# Patient Record
Sex: Female | Born: 1955 | Hispanic: No | Marital: Single | Smoking: Never smoker
Health system: Southern US, Community
[De-identification: ages and names within clinical notes are randomized; demographics above are authoritative.]

## PROBLEM LIST (undated history)

## (undated) DIAGNOSIS — E669 Obesity, unspecified: Secondary | ICD-10-CM

## (undated) DIAGNOSIS — M545 Low back pain, unspecified: Secondary | ICD-10-CM

## (undated) DIAGNOSIS — I10 Essential (primary) hypertension: Secondary | ICD-10-CM

## (undated) DIAGNOSIS — I639 Cerebral infarction, unspecified: Secondary | ICD-10-CM

## (undated) DIAGNOSIS — E7800 Pure hypercholesterolemia, unspecified: Secondary | ICD-10-CM

## (undated) DIAGNOSIS — R600 Localized edema: Secondary | ICD-10-CM

## (undated) DIAGNOSIS — M79673 Pain in unspecified foot: Secondary | ICD-10-CM

## (undated) DIAGNOSIS — K219 Gastro-esophageal reflux disease without esophagitis: Secondary | ICD-10-CM

## (undated) DIAGNOSIS — G8929 Other chronic pain: Secondary | ICD-10-CM

## (undated) DIAGNOSIS — I6381 Other cerebral infarction due to occlusion or stenosis of small artery: Secondary | ICD-10-CM

## (undated) DIAGNOSIS — G5603 Carpal tunnel syndrome, bilateral upper limbs: Secondary | ICD-10-CM

## (undated) DIAGNOSIS — R300 Dysuria: Secondary | ICD-10-CM

## (undated) HISTORY — DX: Localized edema: R60.0

## (undated) HISTORY — DX: Gastro-esophageal reflux disease without esophagitis: K21.9

## (undated) HISTORY — PX: ABDOMINAL HYSTERECTOMY: SHX81

## (undated) HISTORY — DX: Cerebral infarction, unspecified: I63.9

## (undated) HISTORY — DX: Hypercalcemia: E83.52

## (undated) HISTORY — DX: Pain in unspecified foot: M79.673

## (undated) HISTORY — DX: Carpal tunnel syndrome, bilateral upper limbs: G56.03

## (undated) HISTORY — DX: Essential (primary) hypertension: I10

## (undated) HISTORY — DX: Other cerebral infarction due to occlusion or stenosis of small artery: I63.81

## (undated) HISTORY — DX: Obesity, unspecified: E66.9

## (undated) HISTORY — PX: BREAST EXCISIONAL BIOPSY: SUR124

## (undated) HISTORY — DX: Dysuria: R30.0

## (undated) HISTORY — DX: Other chronic pain: G89.29

## (undated) HISTORY — DX: Low back pain, unspecified: M54.50

## (undated) HISTORY — DX: Low back pain: M54.5

## (undated) HISTORY — DX: Pure hypercholesterolemia, unspecified: E78.00

---

## 2014-06-28 ENCOUNTER — Ambulatory Visit: Payer: Self-pay | Admitting: Internal Medicine

## 2014-07-01 ENCOUNTER — Encounter: Payer: Self-pay | Admitting: Internal Medicine

## 2014-07-01 ENCOUNTER — Ambulatory Visit: Payer: Medicaid Other | Attending: Internal Medicine | Admitting: Internal Medicine

## 2014-07-01 VITALS — BP 146/88 | HR 69 | Temp 98.6°F | Resp 16 | Ht 62.21 in | Wt 201.0 lb

## 2014-07-01 DIAGNOSIS — M545 Low back pain, unspecified: Secondary | ICD-10-CM

## 2014-07-01 DIAGNOSIS — E785 Hyperlipidemia, unspecified: Secondary | ICD-10-CM | POA: Diagnosis not present

## 2014-07-01 DIAGNOSIS — Z0000 Encounter for general adult medical examination without abnormal findings: Secondary | ICD-10-CM | POA: Diagnosis present

## 2014-07-01 DIAGNOSIS — I10 Essential (primary) hypertension: Secondary | ICD-10-CM

## 2014-07-01 DIAGNOSIS — Z90710 Acquired absence of both cervix and uterus: Secondary | ICD-10-CM | POA: Diagnosis not present

## 2014-07-01 DIAGNOSIS — Z8249 Family history of ischemic heart disease and other diseases of the circulatory system: Secondary | ICD-10-CM | POA: Diagnosis not present

## 2014-07-01 DIAGNOSIS — M5136 Other intervertebral disc degeneration, lumbar region: Secondary | ICD-10-CM | POA: Diagnosis not present

## 2014-07-01 MED ORDER — DICLOFENAC SODIUM 1 % TD GEL: 2.0000 g | Freq: Four times a day (QID) | TRANSDERMAL | Status: DC

## 2014-07-01 MED ORDER — TRAMADOL HCL 50 MG PO TABS: 50.0000 mg | ORAL_TABLET | Freq: Three times a day (TID) | ORAL | Status: DC | PRN

## 2014-07-01 MED ORDER — ROSUVASTATIN CALCIUM 10 MG PO TABS: 10.0000 mg | ORAL_TABLET | Freq: Every day | ORAL | Status: DC

## 2014-07-01 MED ORDER — ATORVASTATIN CALCIUM 10 MG PO TABS: 10.0000 mg | ORAL_TABLET | Freq: Every day | ORAL | Status: DC

## 2014-07-01 MED ORDER — OLMESARTAN MEDOXOMIL-HCTZ 20-12.5 MG PO TABS: 1.0000 | ORAL_TABLET | Freq: Every day | ORAL | Status: DC

## 2014-07-01 MED ORDER — LISINOPRIL-HYDROCHLOROTHIAZIDE 20-12.5 MG PO TABS: 1.0000 | ORAL_TABLET | Freq: Every day | ORAL | Status: DC

## 2014-07-01 NOTE — Progress Notes (Signed)
Patient ID: Brandy MaduraMaida Braun, female   DOB: 08/19/1956, 58 y.o.   MRN: [id number]  WGN:[id number]:[id number]  HQI:[id number]  DOB - 10/26/1955  CC:  Chief Complaint  Patient presents with  . Establish Care       HPI: Brandy Braun is a 58 y.o. female here today to establish medical care.  Patient has a past medical history of hypertension and hyperlipidemia.  She has a family history of hypertension, high cholesterol, and MI's. She has currently been taking crestor 10 mg and telmisartan 40 mg daily.  She has no medication complaints at this time.  She [place] moved here [place] 2 [place] ago with family members.  She present with concerns of lower back pain.  She has a letter from her physician that states that she has severe LBP without sciatica with DDD of L4-L5 and buldging disc at that level.  She has taken tylenol extra strenth, muscle relaxtants, naprosyn, and othetr pain relievers from her country without relief.  She has had a full hysterectomy while in her country, she has never had intercourse due to religion concerns.     Last blood work was in July/2015---trig-80 cholest-197 HDL-65 LDL is 116   Patient has No headache, No chest pain, No abdominal pain - No Nausea, No new weakness tingling or numbness, No Cough - SOB.  Allergies not on file History reviewed. No pertinent past medical history. No current outpatient prescriptions on file prior to visit.   No current facility-administered medications on file prior to visit.   History reviewed. No pertinent family history. History   Social History  . Marital Status: Single    Spouse Name: N/A    Number of Children: N/A  . Years of Education: N/A   Occupational History  . Not on file.   Social History Main Topics  . Smoking status: Never Smoker   . Smokeless tobacco: Not on file  . Alcohol Use: No  . Drug Use: No  . Sexual Activity: No   Other Topics Concern  . Not on file   Social History Narrative  . No narrative on file      Review of Systems: Constitutional: Negative for fever, chills, diaphoresis, activity change, appetite change and fatigue. HENT: Negative for ear pain, nosebleeds, congestion, facial swelling, rhinorrhea, neck pain, neck stiffness and ear discharge.  Eyes: Negative for pain, discharge, redness, itching and visual disturbance. Respiratory: Negative for cough, choking, chest tightness, shortness of breath, wheezing and stridor.  Cardiovascular: Negative for chest pain, palpitations and leg swelling. Gastrointestinal: Negative for abdominal distention. Genitourinary: Negative for dysuria, urgency, frequency, hematuria, flank pain, decreased urine volume, difficulty urinating and dyspareunia.  Musculoskeletal: Negative for joint swelling, arthralgia and gait problem. + back pain Neurological: Negative for dizziness, tremors, seizures, syncope, facial asymmetry, speech difficulty, weakness, light-headedness, numbness and headaches.  Hematological: Negative for adenopathy. Does not bruise/bleed easily. Psychiatric/Behavioral: Negative for hallucinations, behavioral problems, confusion, dysphoric mood, decreased concentration and agitation.    Objective:   Filed Vitals:   07/01/14 1101  BP: 155/97  Pulse: 69  Temp: 98.6 F (37 C)  Resp: 16    Physical Exam  Constitutional: She is oriented to person, place, and time.  Cardiovascular: Normal rate, regular rhythm, normal heart sounds and intact distal pulses.   No murmur heard. Pulmonary/Chest: Effort normal and breath sounds normal.  Abdominal: Soft. Bowel sounds are normal.  Musculoskeletal: She exhibits edema (Left ankle 2+ non pitting and right trace edema).  Neurological: She is alert and  oriented to person, place, and time.      No results found for: WBC, HGB, HCT, MCV, PLT No results found for: CREATININE, BUN, NA, K, CL, CO2  No results found for: HGBA1C Lipid Panel  No results found for: CHOL, TRIG, HDL, CHOLHDL, VLDL,  LDLCALC     Assessment and plan:   Burton ApleyMaida was seen today for establish care.  Diagnoses and associated orders for this visit:  Essential hypertension Patient will finish remaining telmisartan and switch to zestoretic. After beginning new med she will come back for a BP check in 2 weeks - lisinopril-hydrochlorothiazide (ZESTORETIC) 20-12.5 MG per tablet; Take 1 tablet by mouth daily.  Explained to patient diet modifications that may contribute to elevated BP. Patient will avoid foods that are high in sodium such as  canned soups and vegetables, tomato juice, commercial baked goods, commercially prepared frozen or canned entrees and sauces. Avoid salty snacks, added salt when cooking, and substituting for low sodium herbs or spices Explained exercise regimen of cardio at least three times weekly to help lower BP and cholesterol  HLD (hyperlipidemia) - Discontinue: rosuvastatin (CRESTOR) 10 MG tablet; Take 1 tablet (10 mg total) by mouth daily. Due to cost of med, insurance does not cover - Switched to atorvastatin (LIPITOR) 10 MG tablet; Take 1 tablet (10 mg total) by mouth daily.  Midline low back pain without sciatica - traMADol (ULTRAM) 50 MG tablet; Take 1 tablet (50 mg total) by mouth every 8 (eight) hours as needed. - diclofenac sodium (VOLTAREN) 1 % GEL; Apply 2 g topically 4 (four) times daily.  Patient given exercises to complete at home. Help of heating pad may help with pain   Return in about 4 weeks (around 07/29/2014) for Nurse Visit-BP check and 3 mo PCP.     Holland CommonsKECK, VALERIE, NP-C [facility] [phone] 07/01/2014, 11:24 AM

## 2014-07-01 NOTE — Progress Notes (Signed)
Pt is here to establish care. Pt states that she takes medication for BP and cholesterol. Pt reports having chronic lower back pain.

## 2014-07-01 NOTE — Patient Instructions (Signed)
Exercise to Lose Weight Exercise and a healthy diet may help you lose weight. Your doctor may suggest specific exercises. EXERCISE IDEAS AND TIPS  Choose low-cost things you enjoy doing, such as walking, bicycling, or exercising to workout videos.  Take stairs instead of the elevator.  Walk during your lunch break.  Park your car further away from work or school.  Go to a gym or an exercise class.  Start with 5 to 10 minutes of exercise each day. Build up to 30 minutes of exercise 4 to 6 days a week.  Wear shoes with good support and comfortable clothes.  Stretch before and after working out.  Work out until you breathe harder and your heart beats faster.  Drink extra water when you exercise.  Do not do so much that you hurt yourself, feel dizzy, or get very short of breath. Exercises that burn about 150 calories:  Running 1  miles in 15 minutes.  Playing volleyball for 45 to 60 minutes.  Washing and waxing a car for 45 to 60 minutes.  Playing touch football for 45 minutes.  Walking 1  miles in 35 minutes.  Pushing a stroller 1  miles in 30 minutes.  Playing basketball for 30 minutes.  Raking leaves for 30 minutes.  Bicycling 5 miles in 30 minutes.  Walking 2 miles in 30 minutes.  Dancing for 30 minutes.  Shoveling snow for 15 minutes.  Swimming laps for 20 minutes.  Walking up stairs for 15 minutes.  Bicycling 4 miles in 15 minutes.  Gardening for 30 to 45 minutes.  Jumping rope for 15 minutes.  Washing windows or floors for 45 to 60 minutes. Document Released: 09/14/2010 Document Revised: 11/04/2011 Document Reviewed: 09/14/2010 ExitCare Patient Information 2015 [employer]. This information is not intended to replace advice given to you by your health care provider. Make sure you discuss any questions you have with your health care provider.  

## 2014-07-05 ENCOUNTER — Encounter: Payer: Self-pay | Admitting: Internal Medicine

## 2014-07-29 ENCOUNTER — Ambulatory Visit: Payer: Medicaid Other | Attending: Internal Medicine

## 2014-07-29 ENCOUNTER — Ambulatory Visit: Payer: Medicaid Other | Admitting: *Deleted

## 2014-07-29 VITALS — BP 136/79 | HR 74 | Temp 97.9°F | Resp 20

## 2014-07-29 DIAGNOSIS — I10 Essential (primary) hypertension: Secondary | ICD-10-CM | POA: Diagnosis present

## 2014-07-29 DIAGNOSIS — E785 Hyperlipidemia, unspecified: Secondary | ICD-10-CM | POA: Insufficient documentation

## 2014-07-29 LAB — COMPLETE METABOLIC PANEL WITH GFR
ALBUMIN: 4.1 g/dL (ref 3.5–5.2)
ALT: 14 U/L (ref 0–35)
AST: 15 U/L (ref 0–37)
Alkaline Phosphatase: 74 U/L (ref 39–117)
BUN: 10 mg/dL (ref 6–23)
CALCIUM: 10.1 mg/dL (ref 8.4–10.5)
CO2: 28 mEq/L (ref 19–32)
Chloride: 102 mEq/L (ref 96–112)
Creat: 0.75 mg/dL (ref 0.50–1.10)
GFR, Est African American: >89 mL/min
GFR, Est Non African American: 89 mL/min
Glucose, Bld: 92 mg/dL (ref 70–99)
Potassium: 4.5 mEq/L (ref 3.5–5.3)
SODIUM: 139 meq/L (ref 135–145)
Total Bilirubin: 0.5 mg/dL (ref 0.2–1.2)
Total Protein: 6.8 g/dL (ref 6.0–8.3)

## 2014-07-29 LAB — CBC WITH DIFFERENTIAL/PLATELET
Basophils Absolute: 0.1 10*3/uL (ref 0.0–0.1)
Basophils Relative: 1 % (ref 0–1)
Eosinophils Absolute: 0.2 10*3/uL (ref 0.0–0.7)
Eosinophils Relative: 3 % (ref 0–5)
HCT: 40.8 % (ref 36.0–46.0)
HEMOGLOBIN: 14 g/dL (ref 12.0–15.0)
Lymphocytes Relative: 43 % (ref 12–46)
Lymphs Abs: 2.4 10*3/uL (ref 0.7–4.0)
MCH: 29.1 pg (ref 26.0–34.0)
MCHC: 34.3 g/dL (ref 30.0–36.0)
MCV: 84.8 fL (ref 78.0–100.0)
MPV: 10.3 fL (ref 9.4–12.4)
Monocytes Absolute: 0.3 10*3/uL (ref 0.1–1.0)
Monocytes Relative: 6 % (ref 3–12)
NEUTROS ABS: 2.6 10*3/uL (ref 1.7–7.7)
Neutrophils Relative %: 47 % (ref 43–77)
Platelets: 257 10*3/uL (ref 150–400)
RBC: 4.81 MIL/uL (ref 3.87–5.11)
RDW: 13.4 % (ref 11.5–15.5)
WBC: 5.6 10*3/uL (ref 4.0–10.5)

## 2014-07-29 LAB — LIPID PANEL
Cholesterol: 194 mg/dL (ref 0–200)
HDL: 53 mg/dL (ref >39)
LDL Cholesterol: 126 mg/dL — ABNORMAL HIGH (ref 0–99)
Total CHOL/HDL Ratio: 3.7 Ratio
Triglycerides: 76 mg/dL (ref <150)
VLDL: 15 mg/dL (ref 0–40)

## 2014-07-29 NOTE — Progress Notes (Signed)
Patient presents for BP check Med list reviewed; states taking all meds as directed,however, is fasting this am for blood work and did not take this morning's dose of lisinopril-HCTZ  BP 136/79 P 74 R 20 T 97.9 oral SPO2 99%  Patient advised to call for med refills at least 7 days before running out so as not to go without. Patient aware that she is to f/u with PCP 3 months from last visit (Due 10/01/14)

## 2014-07-30 LAB — VITAMIN D 25 HYDROXY (VIT D DEFICIENCY, FRACTURES): Vit D, 25-Hydroxy: 11 ng/mL — ABNORMAL LOW (ref 30–100)

## 2014-07-30 LAB — TSH: TSH: 1.338 u[IU]/mL (ref 0.350–4.500)

## 2014-08-02 ENCOUNTER — Telehealth: Payer: Self-pay | Admitting: *Deleted

## 2014-08-02 MED ORDER — VITAMIN D (ERGOCALCIFEROL) 1.25 MG (50000 UNIT) PO CAPS: 50000.0000 [IU] | ORAL_CAPSULE | ORAL | Status: DC

## 2014-08-02 NOTE — Telephone Encounter (Signed)
Left message with sister Raynelle FanningMona to return call Rx Ergocalciferol [facility]  [facility] [phone]#[phone] Arabic

## 2014-08-02 NOTE — Telephone Encounter (Signed)
-----   Message from Doris Cheadleeepak Advani, MD sent at 08/01/2014 11:17 AM EST ----- Blood work reviewed, noticed low vitamin D, call patient advise to start ergocalciferol 50,000 units once a week for the duration of  12 weeks.

## 2014-08-25 ENCOUNTER — Telehealth: Payer: Self-pay | Admitting: Internal Medicine

## 2014-08-25 NOTE — Telephone Encounter (Signed)
Patient came into facility to speak to nurse about results, patient states that she did not understand well the results and would like to speak to nurse. Please f/u with pt.

## 2014-08-30 ENCOUNTER — Telehealth: Payer: Self-pay | Admitting: Emergency Medicine

## 2014-08-30 NOTE — Telephone Encounter (Signed)
Left message for pt to return call per Arabic interpretor

## 2014-09-28 ENCOUNTER — Encounter: Payer: Self-pay | Admitting: Internal Medicine

## 2014-09-28 ENCOUNTER — Ambulatory Visit: Payer: Medicaid Other | Attending: Internal Medicine | Admitting: Internal Medicine

## 2014-09-28 VITALS — BP 136/77 | HR 73 | Temp 98.5°F | Resp 16 | Ht 62.0 in | Wt 198.0 lb

## 2014-09-28 DIAGNOSIS — E785 Hyperlipidemia, unspecified: Secondary | ICD-10-CM

## 2014-09-28 DIAGNOSIS — G8929 Other chronic pain: Secondary | ICD-10-CM

## 2014-09-28 DIAGNOSIS — K649 Unspecified hemorrhoids: Secondary | ICD-10-CM

## 2014-09-28 DIAGNOSIS — E559 Vitamin D deficiency, unspecified: Secondary | ICD-10-CM | POA: Diagnosis not present

## 2014-09-28 DIAGNOSIS — M25571 Pain in right ankle and joints of right foot: Secondary | ICD-10-CM | POA: Diagnosis not present

## 2014-09-28 DIAGNOSIS — D649 Anemia, unspecified: Secondary | ICD-10-CM | POA: Diagnosis not present

## 2014-09-28 DIAGNOSIS — I10 Essential (primary) hypertension: Secondary | ICD-10-CM | POA: Diagnosis not present

## 2014-09-28 DIAGNOSIS — B351 Tinea unguium: Secondary | ICD-10-CM

## 2014-09-28 MED ORDER — LISINOPRIL-HYDROCHLOROTHIAZIDE 20-12.5 MG PO TABS: 1.0000 | ORAL_TABLET | Freq: Every day | ORAL | Status: AC

## 2014-09-28 MED ORDER — VITAMIN D (ERGOCALCIFEROL) 1.25 MG (50000 UNIT) PO CAPS
50000.0000 [IU] | ORAL_CAPSULE | ORAL | Status: AC
Start: 2014-09-28 — End: ?

## 2014-09-28 MED ORDER — TERBINAFINE HCL 1 % EX CREA: 1.0000 "application " | TOPICAL_CREAM | Freq: Two times a day (BID) | CUTANEOUS | Status: DC

## 2014-09-28 MED ORDER — HYDROCORTISONE 2.5 % RE CREA: 1.0000 "application " | TOPICAL_CREAM | Freq: Two times a day (BID) | RECTAL | Status: AC

## 2014-09-28 MED ORDER — ATORVASTATIN CALCIUM 10 MG PO TABS
10.0000 mg | ORAL_TABLET | Freq: Every day | ORAL | Status: AC
Start: 2014-09-28 — End: ?

## 2014-09-28 NOTE — Progress Notes (Signed)
Pt is here following up on her HTN and her hyperlipidemia. Pt has no C.C. Today.

## 2014-09-28 NOTE — Progress Notes (Signed)
Patient ID: Brandy MaduraMaida Braun, female   DOB: 01/18/1956, 59 y.o.   MRN: [id number] Subjective:  Brandy Braun is a 59 y.o. female with hypertension.  Current Outpatient Prescriptions  Medication Sig Dispense Refill  . atorvastatin (LIPITOR) 10 MG tablet Take 1 tablet (10 mg total) by mouth daily. 90 tablet 3  . lisinopril-hydrochlorothiazide (ZESTORETIC) 20-12.5 MG per tablet Take 1 tablet by mouth daily. 90 tablet 3  . diclofenac sodium (VOLTAREN) 1 % GEL Apply 2 g topically 4 (four) times daily. (Patient not taking: Reported on 09/28/2014) 100 g 1  . hydrocortisone (ANUSOL-HC) 2.5 % rectal cream Place 1 application rectally 2 (two) times daily. 30 g 2  . terbinafine (LAMISIL AT) 1 % cream Apply 1 application topically 2 (two) times daily. To nails 30 g 0  . Vitamin D, Ergocalciferol, (DRISDOL) 50000 UNITS CAPS capsule Take 1 capsule (50,000 Units total) by mouth every 7 (seven) days. 12 capsule 0   No current facility-administered medications for this visit.    Hypertension ROS: taking medications as instructed, no medication side effects noted, no TIA's, no chest pain on exertion, no dyspnea on exertion and noting swelling of ankles.  New concerns: Painful hemorrhoid, been using suppositories. Chronic ankle pain from a injury in her country a few years ago, would like orthopedic referral.   Objective:  BP 136/77 mmHg  Pulse 73  Temp(Src) 98.5 F (36.9 C) (Oral)  Resp 16  Ht 5\' 2"  (1.575 m)  Wt 198 lb (89.812 kg)  BMI 36.21 kg/m2  SpO2 99%  Appearance alert, well appearing, and in no distress and oriented to person, place, and time. General exam BP noted to be well controlled today in office, S1, S2 normal, no gallop, no murmur, chest clear, no JVD, no HSM, no edema, CVS exam  - normal rate, regular rhythm, normal S1, S2, no murmurs, rubs, clicks or gallops, normal bilateral carotid upstroke without bruits, no JVD.  Lab review: most recent lipid panel reviewed, showing LDL result does not yet  meet goal.    Burton ApleyMaida was seen today for follow-up.  Diagnoses and all orders for this visit:  Essential hypertension Orders: -     lisinopril-hydrochlorothiazide (ZESTORETIC) 20-12.5 MG per tablet; Take 1 tablet by mouth daily. Patient blood pressure is stable and may continue on current medication.  Education on diet, exercise, and modifiable risk factors discussed. Will obtain appropriate labs as needed. Will follow up in 3-6 months.   HLD (hyperlipidemia) Orders: -     atorvastatin (LIPITOR) 10 MG tablet; Take 1 tablet (10 mg total) by mouth daily. Education provided on proper lifestyle changes in order to lower cholesterol. Patient advised to maintain healthy weight and to keep total fat intake at 25-35% of total calories and carbohydrates 50-60% of total daily calories. Explained how high cholesterol places patient at risk for heart disease. Patient placed on appropriate medication and repeat labs in 6 months   Hemorrhoids, unspecified hemorrhoid type Orders: -     hydrocortisone (ANUSOL-HC) 2.5 % rectal cream; Place 1 application rectally 2 (two) times daily.  Toenail fungus Orders: -     terbinafine (LAMISIL AT) 1 % cream; Apply 1 application topically 2 (two) times daily. To nails  Vitamin D deficiency Orders: -     Vitamin D, Ergocalciferol, (DRISDOL) 50000 UNITS CAPS capsule; Take 1 capsule (50,000 Units total) by mouth every 7 (seven) days.  Ankle pain, chronic, right Orders: -     Ambulatory referral to Orthopedic Surgery   Due  to language barrier, an interpreter was present during the history-taking and subsequent discussion (and for part of the physical exam) with this patient.  Return in about 6 months (around 03/29/2015) for Hypertension/HLD.   Holland Commons, NP 09/28/2014 10:44 AM

## 2014-09-28 NOTE — Patient Instructions (Signed)
Vitamin D Deficiency Vitamin D is an important vitamin that your body needs. Having too little of it in your body is called a deficiency. A very bad deficiency can make your bones soft and can cause a condition called rickets.  Vitamin D is important to your body for different reasons, such as:   It helps your body absorb 2 minerals called calcium and phosphorus.  It helps make your bones healthy.  It may prevent some diseases, such as diabetes and multiple sclerosis.  It helps your muscles and heart. You can get vitamin D in several ways. It is a natural part of some foods. The vitamin is also added to some dairy products and cereals. Some people take vitamin D supplements. Also, your body makes vitamin D when you are in the sun. It changes the sun's rays into a form of the vitamin that your body can use. CAUSES   Not eating enough foods that contain vitamin D.  Not getting enough sunlight.  Having certain digestive system diseases that make it hard to absorb vitamin D. These diseases include Crohn's disease, chronic pancreatitis, and cystic fibrosis.  Having a surgery in which part of the stomach or small intestine is removed.  Being obese. Fat cells pull vitamin D out of your blood. That means that obese people may not have enough vitamin D left in their blood and in other body tissues.  Having chronic kidney or liver disease. RISK FACTORS Risk factors are things that make you more likely to develop a vitamin D deficiency. They include:  Being older.  Not being able to get outside very much.  Living in a nursing home.  Having had broken bones.  Having weak or thin bones (osteoporosis).  Having a disease or condition that changes how your body absorbs vitamin D.  Having dark skin.  Some medicines such as seizure medicines or steroids.  Being overweight or obese. SYMPTOMS Mild cases of vitamin D deficiency may not have any symptoms. If you have a very bad case, symptoms  may include:  Bone pain.  Muscle pain.  Falling often.  Broken bones caused by a minor injury, due to osteoporosis. DIAGNOSIS A blood test is the best way to tell if you have a vitamin D deficiency. TREATMENT Vitamin D deficiency can be treated in different ways. Treatment for vitamin D deficiency depends on what is causing it. Options include:  Taking vitamin D supplements.  Taking a calcium supplement. Your caregiver will suggest what dose is best for you. HOME CARE INSTRUCTIONS  Take any supplements that your caregiver prescribes. Follow the directions carefully. Take only the suggested amount.  Have your blood tested 2 months after you start taking supplements.  Eat foods that contain vitamin D. Healthy choices include:  Fortified dairy products, cereals, or juices. Fortified means vitamin D has been added to the food. Check the label on the package to be sure.  Fatty fish like salmon or trout.  Eggs.  Oysters.  Do not use a tanning bed.  Keep your weight at a healthy level. Lose weight if you need to.  Keep all follow-up appointments. Your caregiver will need to perform blood tests to make sure your vitamin D deficiency is going away. SEEK MEDICAL CARE IF:  You have any questions about your treatment.  You continue to have symptoms of vitamin D deficiency.  You have nausea or vomiting.  You are constipated.  You feel confused.  You have severe abdominal or back pain. MAKE   SURE YOU:  Understand these instructions.  Will watch your condition.  Will get help right away if you are not doing well or get worse. Document Released: 11/04/2011 Document Revised: 12/07/2012 Document Reviewed: 11/04/2011 ExitCare Patient Information 2015 [employer]. This information is not intended to replace advice given to you by your health care provider. Make sure you discuss any questions you have with your health care provider.  

## 2015-02-16 LAB — VITAMIN D 25 HYDROXY (VIT D DEFICIENCY, FRACTURES): Vit D, 25-Hydroxy: 20.8

## 2015-02-16 LAB — LIPID PANEL
Cholesterol: 206 — AB (ref 0–200)
LDL Cholesterol: 132

## 2016-08-28 ENCOUNTER — Ambulatory Visit
Admission: RE | Admit: 2016-08-28 | Discharge: 2016-08-28 | Disposition: A | Discharge status: Home / Self Care | Payer: Self-pay | Source: Ambulatory Visit | Attending: Oncology | Admitting: Oncology

## 2016-08-28 ENCOUNTER — Ambulatory Visit: Payer: Self-pay | Attending: Oncology | Admitting: *Deleted

## 2016-08-28 VITALS — BP 125/72 | HR 58 | Temp 97.1°F | Resp 18 | Ht 62.6 in | Wt 184.3 lb

## 2016-08-28 DIAGNOSIS — Z0000 Encounter for general adult medical examination without abnormal findings: Secondary | ICD-10-CM

## 2016-08-28 NOTE — Patient Instructions (Signed)
Gave patient hand-out, Women Staying Healthy, Active and Well from [facility], with education on breast health, pap smears, heart and colon health. 

## 2016-08-28 NOTE — Progress Notes (Signed)
Subjective:     Patient ID: Brandy Braun, female   DOB: 10/30/1955, 61 y.o.   MRN: [id number]  HPI   Review of Systems     Objective:   Physical Exam  Pulmonary/Chest: Right breast exhibits no inverted nipple, no mass, no nipple discharge, no skin change and no tenderness. Left breast exhibits no inverted nipple, no mass, no nipple discharge, no skin change and no tenderness. Breasts are symmetrical.    Large pendulous breast       Assessment:     61 year old [name] [facility] and [facility].  Patient with a history of a hysterectomy for fibroids.  Clinical breast exam unremarkable. Patient did have some tenderness noted on palpation on the right lateral breast. Taught self breast awareness.  Patient refuses pelvic exam.  Patient has been screened for eligibility.  She does not have any insurance, Medicare or Medicaid.  She also meets financial eligibility.  Hand-out given on the Affordable Care Act.    Plan:     Screening mammogram ordered.  Will follow-up per [facility] protocol.

## 2016-08-29 ENCOUNTER — Encounter: Payer: Self-pay | Admitting: *Deleted

## 2016-08-29 NOTE — Progress Notes (Signed)
Letter mailed from [facility] to inform patient of her normal mammogram results.  Patient is to follow-up with annual screening in one year.  HSIS to Christy. 

## 2017-11-12 ENCOUNTER — Ambulatory Visit: Payer: Self-pay

## 2018-08-20 ENCOUNTER — Emergency Department: Payer: BLUE CROSS/BLUE SHIELD

## 2018-08-20 ENCOUNTER — Emergency Department
Admission: EM | Admit: 2018-08-20 | Discharge: 2018-08-20 | Disposition: A | Discharge status: Home / Self Care | Payer: BLUE CROSS/BLUE SHIELD | Attending: Emergency Medicine | Admitting: Emergency Medicine

## 2018-08-20 ENCOUNTER — Other Ambulatory Visit: Payer: Self-pay

## 2018-08-20 DIAGNOSIS — I10 Essential (primary) hypertension: Secondary | ICD-10-CM | POA: Diagnosis not present

## 2018-08-20 DIAGNOSIS — R531 Weakness: Secondary | ICD-10-CM | POA: Insufficient documentation

## 2018-08-20 DIAGNOSIS — M79641 Pain in right hand: Secondary | ICD-10-CM | POA: Diagnosis present

## 2018-08-20 DIAGNOSIS — G609 Hereditary and idiopathic neuropathy, unspecified: Secondary | ICD-10-CM

## 2018-08-20 DIAGNOSIS — G5601 Carpal tunnel syndrome, right upper limb: Secondary | ICD-10-CM | POA: Insufficient documentation

## 2018-08-20 LAB — DIFFERENTIAL
Abs Immature Granulocytes: 0.03 10*3/uL (ref 0.00–0.07)
BASOS PCT: 1 %
Basophils Absolute: 0.1 10*3/uL (ref 0.0–0.1)
Eosinophils Absolute: 0.3 10*3/uL (ref 0.0–0.5)
Eosinophils Relative: 3 %
Immature Granulocytes: 0 %
Lymphocytes Relative: 52 %
Lymphs Abs: 4.9 10*3/uL — ABNORMAL HIGH (ref 0.7–4.0)
MONO ABS: 0.6 10*3/uL (ref 0.1–1.0)
Monocytes Relative: 6 %
NEUTROS PCT: 38 %
Neutro Abs: 3.5 10*3/uL (ref 1.7–7.7)

## 2018-08-20 LAB — CBC
HCT: 40.4 % (ref 36.0–46.0)
Hemoglobin: 13.5 g/dL (ref 12.0–15.0)
MCH: 29.3 pg (ref 26.0–34.0)
MCHC: 33.4 g/dL (ref 30.0–36.0)
MCV: 87.8 fL (ref 80.0–100.0)
Platelets: 215 10*3/uL (ref 150–400)
RBC: 4.6 MIL/uL (ref 3.87–5.11)
RDW: 11.9 % (ref 11.5–15.5)
WBC: 9.4 10*3/uL (ref 4.0–10.5)
nRBC: 0 % (ref 0.0–0.2)

## 2018-08-20 LAB — COMPREHENSIVE METABOLIC PANEL
ALT: 26 U/L (ref 0–44)
AST: 27 U/L (ref 15–41)
Albumin: 4.2 g/dL (ref 3.5–5.0)
Alkaline Phosphatase: 71 U/L (ref 38–126)
Anion gap: 8 (ref 5–15)
BUN: 17 mg/dL (ref 6–20)
CO2: 27 mmol/L (ref 22–32)
Calcium: 10.2 mg/dL (ref 8.9–10.3)
Chloride: 104 mmol/L (ref 98–111)
Creatinine, Ser: 0.76 mg/dL (ref 0.44–1.00)
GFR calc non Af Amer: >60 mL/min (ref >60)
Glucose, Bld: 101 mg/dL — ABNORMAL HIGH (ref 70–99)
Potassium: 3.9 mmol/L (ref 3.5–5.1)
Sodium: 139 mmol/L (ref 135–145)
Total Bilirubin: 0.4 mg/dL (ref 0.3–1.2)
Total Protein: 7.1 g/dL (ref 6.5–8.1)

## 2018-08-20 LAB — PROTIME-INR
INR: 0.92
Prothrombin Time: 12.3 seconds (ref 11.4–15.2)

## 2018-08-20 LAB — TROPONIN I: Troponin I: <0.03 ng/mL (ref <0.03)

## 2018-08-20 LAB — APTT: APTT: 29 s (ref 24–36)

## 2018-08-20 MED ORDER — GABAPENTIN 100 MG PO CAPS: 100.0000 mg | ORAL_CAPSULE | Freq: Three times a day (TID) | ORAL | 2 refills | Status: AC

## 2018-08-20 MED ORDER — GABAPENTIN 100 MG PO CAPS: 100.0000 mg | ORAL_CAPSULE | Freq: Three times a day (TID) | ORAL | 2 refills | Status: DC

## 2018-08-20 MED FILL — GABAPENTIN 100 MG CAPSULE: 100 | 30 days supply | Qty: 90 | Fill #0

## 2018-08-20 NOTE — ED Triage Notes (Addendum)
Pt arrives to ED via POV from home with c/o unilateral right-sided numbness. Pt reports leg numbness x2 days and arm numbness present x1 day. No facial numbness, no facial droop, no difficulty speaking. Pt reports the right hand and foot feels "numb and hot and fire". Pt able to grip bilaterally, but states it's painful to do so (right side is noticeably weaker). No previous h/x of TIA or CVA.

## 2018-08-20 NOTE — ED Notes (Signed)
ED Provider at bedside. 

## 2018-08-20 NOTE — ED Provider Notes (Signed)
[facility] Emergency Department Provider Note       Time seen: ----------------------------------------- 7:08 AM on 08/20/2018 -----------------------------------------   I have reviewed the triage vital signs and the nursing notes.  HISTORY   Chief Complaint Numbness    HPI Brandy Braun is a 62 y.o. female with a history of high blood pressure, hyperlipidemia who presents to the ED for multiple complaints.  Patient arrives from home complaining of right-sided hand numbness and pain.  Pain seems to be over the lateral aspect on the palmar surface.  She describes burning as well.  Pain is worse when she flexes her right wrist.  She also complains of some issues she is having with her feet where they feel numb and sore.    Past Medical History:  Diagnosis Date  . High blood pressure   . High cholesterol   . Hypertension     Patient Active Problem List   Diagnosis Date Noted  . HTN (hypertension) 09/28/2014  . HLD (hyperlipidemia) 09/28/2014    Past Surgical History:  Procedure Laterality Date  . ABDOMINAL HYSTERECTOMY    . BREAST EXCISIONAL BIOPSY Left 90's   benign    Allergies Tramadol  Social History Social History   Tobacco Use  . Smoking status: Never Smoker  . Smokeless tobacco: Never Used  Substance Use Topics  . Alcohol use: No    Alcohol/week: 0.0 standard drinks  . Drug use: No   Review of Systems Constitutional: Negative for fever. Cardiovascular: Negative for chest pain. Respiratory: Negative for shortness of breath. Gastrointestinal: Negative for abdominal pain, vomiting and diarrhea. Musculoskeletal: Negative for back pain.  Positive for right wrist pain Skin: Negative for rash. Neurological: Negative for headaches, positive for right hand paresthesias and pain  All systems negative/normal/unremarkable except as stated in the HPI  ____________________________________________   PHYSICAL EXAM:  VITAL  SIGNS: ED Triage Vitals  Enc Vitals Group     BP 08/20/18 0417 (!) 149/84     Pulse Rate 08/20/18 0417 82     Resp --      Temp 08/20/18 0417 97.7 F (36.5 C)     Temp Source 08/20/18 0417 Oral     SpO2 08/20/18 0417 100 %     Weight 08/20/18 0426 184 lb (83.5 kg)     Height 08/20/18 0426 5\' 4"  (1.626 m)     Head Circumference --      Peak Flow --      Pain Score 08/20/18 0426 9     Pain Loc --      Pain Edu? --      Excl. in GC? --    Constitutional: Alert and oriented. Well appearing and in no distress. Cardiovascular: Normal rate, regular rhythm. No murmurs, rubs, or gallops. Respiratory: Normal respiratory effort without tachypnea nor retractions. Breath sounds are clear and equal bilaterally. No wheezes/rales/rhonchi. Gastrointestinal: Soft and nontender. Normal bowel sounds Musculoskeletal: Nontender with normal range of motion in extremities. No lower extremity tenderness nor edema. Neurologic:  Normal speech and language. No gross focal neurologic deficits are appreciated.  Positive for Tinel and Phalen sign.  Pain seems to be mostly located in the carpal tunnel median nerve distribution Skin:  Skin is warm, dry and intact. No rash noted. Psychiatric: Mood and affect are normal. Speech and behavior are normal.  ____________________________________________  EKG: Interpreted by me.  Sinus rhythm the rate of 73 bpm, normal PR interval, normal QRS, normal QT  ____________________________________________  ED COURSE:  As part of my medical decision making, I reviewed the following data within the electronic MEDICAL RECORD NUMBER History obtained from family if available, nursing notes, old chart and ekg, as well as notes from prior ED visits. Patient presented for mainly paresthesias and pain in her right hand and wrist, we will assess with labs and imaging as indicated at this time.   Procedures ____________________________________________   LABS (pertinent  positives/negatives)  Labs Reviewed  DIFFERENTIAL - Abnormal; Notable for the following components:      Result Value   Lymphs Abs 4.9 (*)    All other components within normal limits  COMPREHENSIVE METABOLIC PANEL - Abnormal; Notable for the following components:   Glucose, Bld 101 (*)    All other components within normal limits  PROTIME-INR  APTT  CBC  TROPONIN I  CBG MONITORING, ED    RADIOLOGY  CT head IMPRESSION: 1. No acute intracranial pathology seen on CT. 2. Mild cerebellar atrophy noted. 3. 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  ____________________________________________  DIFFERENTIAL DIAGNOSIS   Carpal tunnel syndrome, neuropathy  FINAL ASSESSMENT AND PLAN  Carpal tunnel syndrome, peripheral neuropathy   Plan: The patient had presented for pain mainly in her right hand that is worsened with wrist flexion.  This seems consistent with carpal tunnel syndrome. Patient's labs were reassuring. Patient's imaging did reveal some chronic lacunar infarcts in the cerebellar hemispheres.  She was unaware that that she had previously had a stroke.  She describes balance difficulty for over a year.  I did encourage an MRI but she did not want to wait.  This can be done as an outpatient.  She currently does take aspirin daily.  She will be referred to orthopedics for carpal tunnel syndrome.  We will try some gabapentin for neuropathy.   Ulice DashJohnathan E Williams, MD   Note: This note was generated in part or whole with voice recognition software. Voice recognition is usually quite accurate but there are transcription errors that can and very often do occur. I apologize for any typographical errors that were not detected and corrected.    Emily FilbertWilliams, Jonathan E, MD 08/20/18 [id number]

## 2018-09-28 ENCOUNTER — Other Ambulatory Visit: Payer: Self-pay | Admitting: Family Medicine

## 2018-09-28 DIAGNOSIS — Z1231 Encounter for screening mammogram for malignant neoplasm of breast: Secondary | ICD-10-CM

## 2018-10-13 ENCOUNTER — Inpatient Hospital Stay: Admission: RE | Admit: 2018-10-13 | Payer: BLUE CROSS/BLUE SHIELD | Source: Ambulatory Visit

## 2018-10-27 ENCOUNTER — Telehealth: Payer: Self-pay | Admitting: Cardiology

## 2018-10-27 NOTE — Telephone Encounter (Signed)
Called pt and left message for pt to call back to update medical information and to give information to obtain medical records from PCP.

## 2018-11-09 ENCOUNTER — Encounter: Payer: Self-pay | Admitting: Cardiology

## 2018-11-11 ENCOUNTER — Encounter: Payer: Self-pay | Admitting: Cardiology

## 2018-11-11 ENCOUNTER — Ambulatory Visit: Payer: BLUE CROSS/BLUE SHIELD | Admitting: Cardiology

## 2018-11-11 ENCOUNTER — Other Ambulatory Visit: Payer: Self-pay

## 2018-11-11 VITALS — BP 112/70 | HR 59 | Ht 64.0 in | Wt 189.8 lb

## 2018-11-11 DIAGNOSIS — I639 Cerebral infarction, unspecified: Secondary | ICD-10-CM

## 2018-11-11 DIAGNOSIS — I10 Essential (primary) hypertension: Secondary | ICD-10-CM

## 2018-11-11 DIAGNOSIS — R600 Localized edema: Secondary | ICD-10-CM | POA: Diagnosis not present

## 2018-11-11 NOTE — Progress Notes (Addendum)
Cardiology Office Note    Date:  11/11/2018   ID:  Brandy Braun, DOB Aug 31, 1955, MRN [id number]  PCP:  Preston Fleeting, MD  Cardiologist:  Armanda Magic, MD   Chief Complaint  Patient presents with  . Cerebrovascular Accident    History of Present Illness:  Brandy Braun is a 63 y.o. female who is being seen today for the evaluation of recent CVA at the request of Dr. Magdalene Patricia.  This is a 63yo female with a history of chronic bilateral LE edema, HTN, hyperlipidemia and HTN.  She recently presented to the ER with multiple complaints including right sided hand numbness and pain as well as numbness and soreness in her feet.  Head CT was done which showed no acute pathology but there was chronic lacunar infarcts in the cerebellar hemispheres bilaterally.  She is now referred for carotid dopplers, echo and heart monitor to rule out cardiac etiology of CVA.  She  denies any chest pain or pressure, SOB, DOE, PND, orthopnea, palpitations or syncope. She complains of chronic LE edema when standing with burning and needles on the bottom of her feet.  She  is compliant with her meds and is tolerating meds with no SE.     Past Medical History:  Diagnosis Date  . Bilateral leg edema   . Carpal tunnel syndrome, bilateral   . Chronic foot pain   . Chronic low back pain   . Dysuria   . GERD (gastroesophageal reflux disease)   . High blood pressure   . High cholesterol   . Hypercalcemia   . Hypertension   . Lacunar stroke (HCC)   . Obesity   . Stroke [facility])    Hx    Past Surgical History:  Procedure Laterality Date  . ABDOMINAL HYSTERECTOMY    . BREAST EXCISIONAL BIOPSY Left 90's   benign    Current Medications: Current Meds  Medication Sig  . aspirin EC 81 MG tablet Take 81 mg by mouth daily.  Marland Kitchen atorvastatin (LIPITOR) 10 MG tablet Take 1 tablet (10 mg total) by mouth daily.  . diclofenac sodium (VOLTAREN) 1 % GEL Apply 2 g topically 4 (four) times daily.  . Elastic  Bandages & Supports (JOBST KNEE HIGH COMPRESSION SM) MISC by Does not apply route.  . fluticasone (FLONASE) 50 MCG/ACT nasal spray Place 2 sprays into both nostrils daily.  Marland Kitchen gabapentin (NEURONTIN) 100 MG capsule Take 1 capsule (100 mg total) by mouth 3 (three) times daily.  . hydrocortisone (ANUSOL-HC) 2.5 % rectal cream Place 1 application rectally 2 (two) times daily.  Marland Kitchen lisinopril-hydrochlorothiazide (ZESTORETIC) 20-12.5 MG per tablet Take 1 tablet by mouth daily.  Marland Kitchen pyridOXINE (VITAMIN B-6) 100 MG tablet Take 100 mg by mouth daily.  . Vitamin D, Ergocalciferol, (DRISDOL) 50000 UNITS CAPS capsule Take 1 capsule (50,000 Units total) by mouth every 7 (seven) days.    Allergies:   Tramadol   Social History   Socioeconomic History  . Marital status: Single    Spouse name: Not on file  . Number of children: Not on file  . Years of education: Not on file  . Highest education level: Not on file  Occupational History  . Not on file  Social Needs  . Financial resource strain: Not on file  . Food insecurity:    Worry: Not on file    Inability: Not on file  . Transportation needs:    Medical: Not on file    Non-medical: Not on  file  Tobacco Use  . Smoking status: Never Smoker  . Smokeless tobacco: Never Used  Substance and Sexual Activity  . Alcohol use: No    Alcohol/week: 0.0 standard drinks  . Drug use: No  . Sexual activity: Never  Lifestyle  . Physical activity:    Days per week: Not on file    Minutes per session: Not on file  . Stress: Not on file  Relationships  . Social connections:    Talks on phone: Not on file    Gets together: Not on file    Attends religious service: Not on file    Active member of club or organization: Not on file    Attends meetings of clubs or organizations: Not on file    Relationship status: Not on file  Other Topics Concern  . Not on file  Social History Narrative  . Not on file     Family History:  The patient's family history  includes Breast cancer (age of onset: 34) in her sister; Heart disease in her father; Kidney disease (age of onset: 55) in her father; Stroke (age of onset: 70) in her mother.   ROS:   Please see the history of present illness.    ROS All other systems reviewed and are negative.  No flowsheet data found.     PHYSICAL EXAM:   VS:  BP 112/70   Pulse (!) 59   Ht  (1.626 m)   Wt 189 lb 12.8 oz (86.1 kg)   SpO2 99%   BMI 32.58 kg/m    GEN: Well nourished, well developed, in no acute distress  HEENT: normal  Neck: no JVD, carotid bruits, or masses Cardiac: RRR; no murmurs, rubs, or gallops,no edema.  Intact distal pulses bilaterally.  Respiratory:  clear to auscultation bilaterally, normal work of breathing GI: soft, nontender, nondistended, + BS MS: no deformity or atrophy  Skin: warm and dry, no rash Neuro:  Alert and Oriented x 3, Strength and sensation are intact Psych: euthymic mood, full affect  Wt Readings from Last 3 Encounters:  11/11/18 189 lb 12.8 oz (86.1 kg)  08/20/18 184 lb (83.5 kg)  08/28/16 184 lb 4.2 oz (83.6 kg)      Studies/Labs Reviewed:   EKG:  EKG is not ordered today.  The ekg in ER showed NSR with poor R wave progression in anterior leads and nonspecific T wave abnormality2  Recent Labs: 08/20/2018: ALT 26; BUN 17; Creatinine, Ser 0.76; Hemoglobin 13.5; Platelets 215; Potassium 3.9; Sodium 139   Lipid Panel    Component Value Date/Time   CHOL 194 07/29/2014 0928   TRIG 76 07/29/2014 0928   HDL 53 07/29/2014 0928   CHOLHDL 3.7 07/29/2014 0928   VLDL 15 07/29/2014 0928   LDLCALC 126 (H) 07/29/2014 0928    Additional studies/ records that were reviewed today include:  ER notes and EKG    ASSESSMENT:    1. Cerebrovascular accident (CVA), unspecified mechanism (HCC)   2. Essential hypertension   3. Edema of both lower extremities      PLAN:  In order of problems listed above:  1.  Chronic lacunar infarcts in the bilateral  cerebellar hemispheres -will get 2D echo to assess LVF and atria -check carotid dopplers to assess for carotid stenosis -Event monitor to assess for atrial arrhytmias -if event monitor normal then will refer to EP for loop recorder  2.  HTN - BP is controlled on exam today -continue on Lisinopril-HCT  20-12.5mg  daily  3.  Bilateral LE edema -none on exam today -encouraged low sodium diet and elevate legs at night -encouraged her to talk with her PCP about her pins and needles and numbness in feet which is likely a neuropathy - she has good distal pulses on exam so likley not related to PAD    Medication Adjustments/Labs and Tests Ordered: Current medicines are reviewed at length with the patient today.  Concerns regarding medicines are outlined above.  Medication changes, Labs and Tests ordered today are listed in the Patient Instructions below.  Patient Instructions  Medication Instructions:  Your physician recommends that you continue on your current medications as directed. Please refer to the Current Medication list given to you today.  If you need a refill on your cardiac medications before your next appointment, please call your pharmacy.   Lab work: None If you have labs (blood work) drawn today and your tests are completely normal, you will receive your results only by: Marland Kitchen MyChart Message (if you have MyChart) OR . A paper copy in the mail If you have any lab test that is abnormal or we need to change your treatment, we will call you to review the results.  Testing/Procedures: Your physician has requested that you have an echocardiogram with bubble study. Echocardiography is a painless test that uses sound waves to create images of your heart. It provides your doctor with information about the size and shape of your heart and how well your heart's chambers and valves are working. This procedure takes approximately one hour. There are no restrictions for this procedure.  Your  physician has recommended that you wear an event monitor. Event monitors are medical devices that record the heart's electrical activity. Doctors most often [place] these monitors to diagnose arrhythmias. Arrhythmias are problems with the speed or rhythm of the heartbeat. The monitor is a small, portable device. You can wear one while you do your normal daily activities. This is usually used to diagnose what is causing palpitations/syncope (passing out).  Your physician has requested that you have a carotid duplex. This test is an ultrasound of the carotid arteries in your neck. It looks at blood flow through these arteries that supply the brain with blood. Allow one hour for this exam. There are no restrictions or special instructions.  Follow-Up: Your physician recommends that you schedule a follow-up appointment in: 1-2 months with the PA.        Signed, Armanda Magic, MD  11/11/2018 8:09 PM    [facility] [address] [place], [place], [place]  [address] Phone: [phone]; Fax: [phone]

## 2018-11-11 NOTE — Patient Instructions (Signed)
Medication Instructions:  Your physician recommends that you continue on your current medications as directed. Please refer to the Current Medication list given to you today.  If you need a refill on your cardiac medications before your next appointment, please call your pharmacy.   Lab work: None If you have labs (blood work) drawn today and your tests are completely normal, you will receive your results only by: Marland Kitchen MyChart Message (if you have MyChart) OR . A paper copy in the mail If you have any lab test that is abnormal or we need to change your treatment, we will call you to review the results.  Testing/Procedures: Your physician has requested that you have an echocardiogram with bubble study. Echocardiography is a painless test that uses sound waves to create images of your heart. It provides your doctor with information about the size and shape of your heart and how well your heart's chambers and valves are working. This procedure takes approximately one hour. There are no restrictions for this procedure.  Your physician has recommended that you wear an event monitor. Event monitors are medical devices that record the heart's electrical activity. Doctors most often [place] these monitors to diagnose arrhythmias. Arrhythmias are problems with the speed or rhythm of the heartbeat. The monitor is a small, portable device. You can wear one while you do your normal daily activities. This is usually used to diagnose what is causing palpitations/syncope (passing out).  Your physician has requested that you have a carotid duplex. This test is an ultrasound of the carotid arteries in your neck. It looks at blood flow through these arteries that supply the brain with blood. Allow one hour for this exam. There are no restrictions or special instructions.  Follow-Up: Your physician recommends that you schedule a follow-up appointment in: 1-2 months with the PA.

## 2018-11-12 ENCOUNTER — Ambulatory Visit
Admission: RE | Admit: 2018-11-12 | Discharge: 2018-11-12 | Disposition: A | Discharge status: Home / Self Care | Payer: BLUE CROSS/BLUE SHIELD | Source: Ambulatory Visit | Attending: Family Medicine | Admitting: Family Medicine

## 2018-11-12 DIAGNOSIS — Z1231 Encounter for screening mammogram for malignant neoplasm of breast: Secondary | ICD-10-CM | POA: Diagnosis present

## 2018-11-18 ENCOUNTER — Ambulatory Visit (HOSPITAL_COMMUNITY)
Admission: RE | Admit: 2018-11-18 | Discharge: 2018-11-18 | Disposition: A | Discharge status: Home / Self Care | Payer: BLUE CROSS/BLUE SHIELD | Source: Ambulatory Visit | Attending: Internal Medicine | Admitting: Internal Medicine

## 2018-11-18 ENCOUNTER — Other Ambulatory Visit: Payer: Self-pay

## 2018-11-18 DIAGNOSIS — I639 Cerebral infarction, unspecified: Secondary | ICD-10-CM | POA: Diagnosis not present

## 2018-11-23 ENCOUNTER — Encounter: Payer: Self-pay | Admitting: Cardiology

## 2018-11-23 NOTE — Telephone Encounter (Signed)
See result note.  

## 2018-11-23 NOTE — Telephone Encounter (Signed)
Patient returned call for test results.  °

## 2018-11-23 NOTE — Telephone Encounter (Signed)
This is Dr Norris Cross patient, will forward to [facility] triage

## 2018-12-01 ENCOUNTER — Telehealth: Payer: Self-pay | Admitting: Cardiology

## 2018-12-01 NOTE — Telephone Encounter (Signed)
Based on chart review I would postpone this patients echocardiogram for 2 months, please call the patient with the new date. Thank you, Tobias Alexander

## 2018-12-02 ENCOUNTER — Other Ambulatory Visit: Payer: Self-pay | Admitting: Cardiology

## 2018-12-02 DIAGNOSIS — I639 Cerebral infarction, unspecified: Secondary | ICD-10-CM

## 2018-12-02 DIAGNOSIS — I4891 Unspecified atrial fibrillation: Secondary | ICD-10-CM

## 2018-12-03 ENCOUNTER — Other Ambulatory Visit: Payer: Self-pay

## 2018-12-03 ENCOUNTER — Telehealth: Payer: Self-pay | Admitting: Radiology

## 2018-12-03 ENCOUNTER — Ambulatory Visit (HOSPITAL_COMMUNITY): Payer: BLUE CROSS/BLUE SHIELD | Attending: Cardiology

## 2018-12-03 ENCOUNTER — Ambulatory Visit: Payer: BLUE CROSS/BLUE SHIELD

## 2018-12-03 DIAGNOSIS — I639 Cerebral infarction, unspecified: Secondary | ICD-10-CM | POA: Diagnosis present

## 2018-12-03 NOTE — Telephone Encounter (Signed)
Enrolled patient for a 30 day [employer] monitor to be mailed due to Covid-19. Patient informed monitor will arrive in 3-5 days. Company has been alerted her primary language in Arabic.

## 2018-12-09 ENCOUNTER — Ambulatory Visit (INDEPENDENT_AMBULATORY_CARE_PROVIDER_SITE_OTHER): Payer: BLUE CROSS/BLUE SHIELD

## 2018-12-09 DIAGNOSIS — I639 Cerebral infarction, unspecified: Secondary | ICD-10-CM

## 2018-12-09 DIAGNOSIS — I4891 Unspecified atrial fibrillation: Secondary | ICD-10-CM | POA: Diagnosis not present

## 2018-12-10 ENCOUNTER — Telehealth: Payer: Self-pay | Admitting: Cardiology

## 2018-12-10 ENCOUNTER — Other Ambulatory Visit: Payer: Self-pay

## 2018-12-10 NOTE — Telephone Encounter (Signed)
Informed patient of results and verbal understanding expressed.  

## 2018-12-10 NOTE — Telephone Encounter (Signed)
Follow Up:    Pt would like her Echo results from 12-03-18 please.

## 2018-12-14 ENCOUNTER — Telehealth: Payer: Self-pay

## 2018-12-14 NOTE — Telephone Encounter (Signed)
Called and left a message for patient via interpreter letting her know that the appointment on 4/21 has been cancelled and she will need to call back and schedule a virtual visit once her monitor is complete.

## 2018-12-14 NOTE — Telephone Encounter (Signed)
Left message via interpreter for patient to call back to transition to virtual visit and potentially reschedule appointment as her monitor results have not come back yet. Sent a message to [place] regarding results.

## 2018-12-14 NOTE — Progress Notes (Deleted)
{Choose 1 Note Type (Telehealth Visit or Telephone Visit):[phone]}   Evaluation Performed:  Follow-up visit  Date:  12/14/2018   ID:  Brandy Braun, DOB 04/04/1956, MRN [id number]  {Patient Location:[phone]::"Home"} {Provider [phone]}  PCP:  Preston Fleetingevelo, Adrian Mancheno, MD  Cardiologist:  No primary care provider on file. *** Electrophysiologist:  None   Chief Complaint:  ***  History of Present Illness:    Brandy Braun is a 63 y.o. female with ***  The patient {does/does not:200015} have symptoms concerning for COVID-19 infection (fever, chills, cough, or new shortness of breath).    Past Medical History:  Diagnosis Date  . Bilateral leg edema   . Carpal tunnel syndrome, bilateral   . Chronic foot pain   . Chronic low back pain   . Dysuria   . GERD (gastroesophageal reflux disease)   . High blood pressure   . High cholesterol   . Hypercalcemia   . Hypertension   . Lacunar stroke (HCC)   . Obesity   . Stroke [facility](HCC)    Hx   Past Surgical History:  Procedure Laterality Date  . ABDOMINAL HYSTERECTOMY    . BREAST EXCISIONAL BIOPSY Left 90's   benign     No outpatient medications have been marked as taking for the 12/15/18 encounter (Appointment) with Dyann KiefLenze, Michele M, PA-C.     Allergies:   Tramadol   Social History   Tobacco Use  . Smoking status: Never Smoker  . Smokeless tobacco: Never Used  Substance Use Topics  . Alcohol use: No    Alcohol/week: 0.0 standard drinks  . Drug use: No     Family Hx: The patient's family history includes Breast cancer (age of onset: 5755) in her sister; Heart disease in her father; Kidney disease (age of onset: 8675) in her father; Stroke (age of onset: 5039) in her mother. There is no history of High Cholesterol.  ROS:   Please see the history of present illness.    *** All other systems reviewed and are negative.   Prior CV studies:   The following studies were reviewed today:  ***  Labs/Other Tests and  Data Reviewed:    EKG:  {EKG/Telemetry Strips Reviewed:[phone]}  Recent Labs: 08/20/2018: ALT 26; BUN 17; Creatinine, Ser 0.76; Hemoglobin 13.5; Platelets 215; Potassium 3.9; Sodium 139   Recent Lipid Panel Lab Results  Component Value Date/Time   CHOL 194 07/29/2014 09:28 AM   TRIG 76 07/29/2014 09:28 AM   HDL 53 07/29/2014 09:28 AM   CHOLHDL 3.7 07/29/2014 09:28 AM   LDLCALC 126 (H) 07/29/2014 09:28 AM    Wt Readings from Last 3 Encounters:  11/11/18 189 lb 12.8 oz (86.1 kg)  08/20/18 184 lb (83.5 kg)  08/28/16 184 lb 4.2 oz (83.6 kg)     Objective:    Vital Signs:  There were no vitals taken for this visit.   {[facility] Virtual Exam (Optional):[phone]::"VITAL SIGNS:  reviewed"}  ASSESSMENT & PLAN:    1. ***  COVID-19 Education: The signs and symptoms of COVID-19 were discussed with the patient and how to seek care for testing (follow up with PCP or arrange E-visit).  ***The importance of social distancing was discussed today.  Time:   Today, I have spent *** minutes with the patient with telehealth technology discussing the above problems.     Medication Adjustments/Labs and Tests Ordered: Current medicines are reviewed at length with the patient today.  Concerns regarding medicines are outlined above.   Tests Ordered: No orders  of the defined types were placed in this encounter.   Medication Changes: No orders of the defined types were placed in this encounter.   Disposition:  Follow up {follow up:15908}  Signed, Jacolyn Reedy, PA-C  12/14/2018 1:01 PM    [facility]

## 2018-12-14 NOTE — Telephone Encounter (Signed)
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  Patient did not start 30 day cardiac event monitor until 12/09/18, estimated EOS date 01/07/19 so we should have results by 01/10/19.  Patient has 2 recordings to date, both Normal Sinus Rhythm.  Thanks, Burnett Harry ----- Message ----- From: Lattie Haw, RN Sent: 12/14/2018  12:37 PM EDT To: Ernst Bowler Subject: Monitor results                                Are these results available yet? Patient has an appointment tomorrow?

## 2018-12-15 ENCOUNTER — Ambulatory Visit: Payer: BLUE CROSS/BLUE SHIELD | Admitting: Physician Assistant

## 2019-01-05 ENCOUNTER — Other Ambulatory Visit: Payer: Self-pay

## 2019-01-13 ENCOUNTER — Telehealth: Payer: Self-pay

## 2019-01-13 DIAGNOSIS — I639 Cerebral infarction, unspecified: Secondary | ICD-10-CM

## 2019-01-13 NOTE — Telephone Encounter (Signed)
-----   Message from Quintella Reichert, MD sent at 01/11/2019  1:26 PM EDT ----- Please let patient know that her heart monitor was fine - refer to EP for ILR

## 2019-01-13 NOTE — Telephone Encounter (Signed)
Spoke with the patient, she expressed understanding about referring the EP for loop recorder.

## 2019-01-15 NOTE — Telephone Encounter (Signed)
Dr. Norris Cross RN arranging for appt with EP for ILR.

## 2019-01-22 ENCOUNTER — Telehealth: Payer: Self-pay

## 2019-01-22 NOTE — Telephone Encounter (Signed)
Spoke with the patient, she stated she did not want a loop recorder at this time. She will call the office if she changes her decision. She had no further questions.

## 2019-01-22 NOTE — Telephone Encounter (Signed)
I called and spoke with patient, she cancelled appointment with Dr. Graciela Husbands on 01/28/19. She states that she talked to someone here and they told her everything was normal with her heart. She states she was told she did not need a loop recorder.

## 2019-01-26 ENCOUNTER — Ambulatory Visit (INDEPENDENT_AMBULATORY_CARE_PROVIDER_SITE_OTHER): Payer: BLUE CROSS/BLUE SHIELD | Admitting: Neurology

## 2019-01-26 ENCOUNTER — Encounter: Payer: Self-pay | Admitting: Neurology

## 2019-01-26 ENCOUNTER — Other Ambulatory Visit: Payer: Self-pay

## 2019-01-26 VITALS — BP 136/82 | HR 76 | Temp 98.2°F | Ht 64.0 in | Wt 189.0 lb

## 2019-01-26 DIAGNOSIS — R202 Paresthesia of skin: Secondary | ICD-10-CM

## 2019-01-26 DIAGNOSIS — G8929 Other chronic pain: Secondary | ICD-10-CM

## 2019-01-26 DIAGNOSIS — M545 Low back pain, unspecified: Secondary | ICD-10-CM

## 2019-01-26 NOTE — Progress Notes (Signed)
Reason for visit: Paresthesias in the feet, right hand pain  Referring physician: Dr. Rayetta Humphrey is a 63 y.o. female  History of present illness:  Brandy Braun is a 63 year old right-handed female from [place], she has been in the [place] for about 4 years.  Prior to leaving [place] she had significant issues with her low back with discomfort in the back with sciatica on either side.  She apparently had MRI evaluation of the low back at that time and was told that she needed surgery for degenerative disc disease.  She never had lumbosacral spine surgery, however.  The patient has begun having some paresthesias in both feet about 18 months ago, she has reported some difficulty with balance, she has not had any falls.  She denies issues controlling the bowels or the bladder.  She continues to have her low back pain again without sciatica, but she has paresthesias in the left greater than right foot, particularly on the lateral aspect in the heel of the feet.  The patient reports more discomfort when she is up on her feet, she is also bothered by some burning discomfort at nighttime.  She went to the emergency room on 20 August 2018 with right hand numbness and pain.  She continues to have right wrist discomfort, at that time she had a CT of the head that showed some chronic small vessel changes in the cerebellum on the right and was set up to see a cardiologist, Dr. Mayford Knife.  She underwent a 2D echocardiogram, carotid Doppler study, and telemetry evaluation that were all unremarkable.  The patient has remained on low-dose aspirin.  She does not report any significant neck pain or pain down either arm.  She is sent to this office for further evaluation.  Past Medical History:  Diagnosis Date  . Bilateral leg edema   . Carpal tunnel syndrome, bilateral   . Chronic foot pain   . Chronic low back pain   . Dysuria   . GERD (gastroesophageal reflux disease)   . High blood pressure    . High cholesterol   . Hypercalcemia   . Hypertension   . Lacunar stroke (HCC)   . Obesity   . Stroke [facility])    Braun    Past Surgical History:  Procedure Laterality Date  . ABDOMINAL HYSTERECTOMY    . BREAST EXCISIONAL BIOPSY Left 90's   benign    Family History  Problem Relation Age of Onset  . Stroke Mother 2  . Kidney disease Father 87       MI  . Heart disease Father   . Breast cancer Sister 28  . High Cholesterol Neg Braun     Social history:  reports that she has never smoked. She has never used smokeless tobacco. She reports that she does not drink alcohol or use drugs.  Medications:  Prior to Admission medications   Medication Sig Start Date End Date Taking? Authorizing Provider  aspirin EC 81 MG tablet Take 81 mg by mouth daily.   Yes [provider]  atorvastatin (LIPITOR) 10 MG tablet Take 1 tablet (10 mg total) by mouth daily. 09/28/14  Yes Ambrose Finland, NP  Elastic Bandages & Supports (JOBST KNEE HIGH COMPRESSION SM) MISC by Does not apply route.   Yes [provider]  fluticasone (FLONASE) 50 MCG/ACT nasal spray Place 2 sprays into both nostrils daily.   Yes [provider]  gabapentin (NEURONTIN) 100 MG capsule Take 1 capsule (  100 mg total) by mouth 3 (three) times daily. 08/20/18 08/20/19 Yes Emily Filbert, MD  hydrocortisone (ANUSOL-HC) 2.5 % rectal cream Place 1 application rectally 2 (two) times daily. 09/28/14  Yes Ambrose Finland, NP  lisinopril-hydrochlorothiazide (ZESTORETIC) 20-12.5 MG per tablet Take 1 tablet by mouth daily. 09/28/14  Yes Ambrose Finland, NP  pyridOXINE (VITAMIN B-6) 100 MG tablet Take 100 mg by mouth daily.   Yes [provider]  Vitamin D, Ergocalciferol, (DRISDOL) 50000 UNITS CAPS capsule Take 1 capsule (50,000 Units total) by mouth every 7 (seven) days. 09/28/14  Yes Ambrose Finland, NP      Allergies  Allergen Reactions  . Tramadol Palpitations    ROS:  Out of a complete 14 system review  of symptoms, the patient complains only of the following symptoms, and all other reviewed systems are negative.  Low back pain Numbness in the feet Walking problems  Blood pressure 136/82, pulse 76, temperature 98.2 F (36.8 C), temperature source Oral, height 5\' 4"  (1.626 m), weight 189 lb (85.7 kg).  Physical Exam  General: The patient is alert and cooperative at the time of the examination.  The patient is moderately obese.  Eyes: Pupils are equal, round, and reactive to light. Discs are flat bilaterally.  Neck: The neck is supple, no carotid bruits are noted.  Respiratory: The respiratory examination is clear.  Cardiovascular: The cardiovascular examination reveals a regular rate and rhythm, no obvious murmurs or rubs are noted.  Neuromuscular: The patient is able to flex the low back to about 90 degrees.  Good rotational movement of the low back is noted.  Skin: Extremities are without significant edema.  Neurologic Exam  Mental status: The patient is alert and oriented x 3 at the time of the examination. The patient has apparent normal recent and remote memory, with an apparently normal attention span and concentration ability.  Cranial nerves: Facial symmetry is present. There is good sensation of the face to pinprick and soft touch bilaterally. The strength of the facial muscles and the muscles to head turning and shoulder shrug are normal bilaterally. Speech is well enunciated, no aphasia or dysarthria is noted. Extraocular movements are full. Visual fields are full. The tongue is midline, and the patient has symmetric elevation of the soft palate. No obvious hearing deficits are noted.  Motor: The motor testing reveals 5 over 5 strength of all 4 extremities. Good symmetric motor tone is noted throughout.  Sensory: Sensory testing is intact to pinprick, soft touch, vibration sensation, and position sense on all 4 extremities, with exception that there is a stocking pattern  pinprick sensory deficit across the ankles bilaterally. No evidence of extinction is noted.  Coordination: Cerebellar testing reveals good finger-nose-finger and heel-to-shin bilaterally.  Gait and station: Gait is normal. Tandem gait is normal. Romberg is negative. No drift is seen.  The patient is able to stand and walk on heels and toes bilaterally.  Reflexes: Deep tendon reflexes are symmetric and normal bilaterally.  The ankle jerk reflexes are well-maintained bilaterally.  Toes are downgoing bilaterally.   Assessment/Plan:  1.  Right hand discomfort, bilateral foot paresthesias  2.  Chronic low back pain  The patient could potentially have a small fiber neuropathy, but her examination reveals excellent reflexes throughout to include the ankle jerk reflexes.  The patient does have a chronic history of low back pain, it is possible that spinal stenosis could cause numbness and paresthesias in the feet.  We discussed doing  EMG and nerve conduction study but the patient wishes to have MRI of the low back first.  Blood work will be done today.  She will follow-up in 4 months.  She is on low-dose gabapentin without much benefit, this could be increased in the future.   Brandy Braun. Keith Olusegun Gerstenberger MD 01/26/2019 11:36 AM  [facility] [address] [place] [id number]  Phone [phone] Fax [phone]

## 2019-01-27 LAB — MULTIPLE MYELOMA PANEL, SERUM

## 2019-01-28 ENCOUNTER — Telehealth: Payer: Self-pay | Admitting: Neurology

## 2019-01-28 ENCOUNTER — Telehealth: Payer: BLUE CROSS/BLUE SHIELD | Admitting: Internal Medicine

## 2019-01-28 LAB — B. BURGDORFI ANTIBODIES: Lyme IgG/IgM Ab: <0.91 {ISR} (ref 0.00–0.90)

## 2019-01-28 LAB — MULTIPLE MYELOMA PANEL, SERUM
Albumin SerPl Elph-Mcnc: 3.5 g/dL (ref 2.9–4.4)
Albumin/Glob SerPl: 1.1 (ref 0.7–1.7)
Alpha 1: 0.2 g/dL (ref 0.0–0.4)
Alpha2 Glob SerPl Elph-Mcnc: 0.8 g/dL (ref 0.4–1.0)
B-Globulin SerPl Elph-Mcnc: 1.2 g/dL (ref 0.7–1.3)
Gamma Glob SerPl Elph-Mcnc: 1 g/dL (ref 0.4–1.8)
Globulin, Total: 3.2 g/dL (ref 2.2–3.9)
IgA/Immunoglobulin A, Serum: 306 mg/dL (ref 87–352)
IgG (Immunoglobin G), Serum: 1113 mg/dL (ref 586–1602)
IgM (Immunoglobulin M), Srm: 39 mg/dL (ref 26–217)
M Protein SerPl Elph-Mcnc: 0.1 g/dL — ABNORMAL HIGH
Total Protein: 6.7 g/dL (ref 6.0–8.5)

## 2019-01-28 LAB — RHEUMATOID FACTOR: Rhuematoid fact SerPl-aCnc: <10 IU/mL (ref 0.0–13.9)

## 2019-01-28 LAB — ANGIOTENSIN CONVERTING ENZYME: Angio Convert Enzyme: 8 U/L — ABNORMAL LOW (ref 14–82)

## 2019-01-28 LAB — ANA W/REFLEX: Anti Nuclear Antibody (ANA): NEGATIVE

## 2019-01-28 LAB — VITAMIN B12: Vitamin B-12: 533 pg/mL (ref 232–1245)

## 2019-01-28 LAB — SEDIMENTATION RATE: Sed Rate: 8 mm/hr (ref 0–40)

## 2019-01-28 NOTE — Telephone Encounter (Signed)
[employer] Auth: [id number] (exp. 01/28/19 to 07/26/19) order sent to GI. They will reach out to the patient to schedule.

## 2019-01-28 NOTE — Telephone Encounter (Signed)
I called the patient.  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  I discussed this with the patient, all the other blood work was unremarkable.  I recommended an oncology evaluation, she does not wish to pursue this.  She will be set up for MRI of the lumbar spine, I will call her when I get this result.

## 2019-02-01 NOTE — Telephone Encounter (Signed)
Letter with pt's lab results have been mailed to the pt's home address.

## 2019-02-05 ENCOUNTER — Ambulatory Visit
Admission: RE | Admit: 2019-02-05 | Discharge: 2019-02-05 | Disposition: A | Discharge status: Home / Self Care | Payer: BLUE CROSS/BLUE SHIELD | Source: Ambulatory Visit | Attending: Neurology | Admitting: Neurology

## 2019-02-05 DIAGNOSIS — G8929 Other chronic pain: Secondary | ICD-10-CM

## 2019-02-05 DIAGNOSIS — R202 Paresthesia of skin: Secondary | ICD-10-CM

## 2019-02-07 ENCOUNTER — Telehealth: Payer: Self-pay | Admitting: Neurology

## 2019-02-07 NOTE — Telephone Encounter (Signed)
I called the patient.  MRI of the low back does show multilevel degenerative changes but no evidence of spinal stenosis or significant nerve root impingement.  If the patient desires to have EMG and nerve conduction study as we discussed in the office, she is to contact our office.  There appears to be some straightening of the lumbar spine which could suggest significant muscle spasm.    MRI lumbar 02/05/19:  IMPRESSION: 1. Disc bulging with facet hypertrophy at L3-4 with resultant mild bilateral lateral recess stenosis (descending L4 nerve root level) with bilateral L3 foraminal narrowing. 2. Disc bulging with reactive endplate changes and facet hypertrophy at L5-S1 with resultant mild to moderate left lateral recess stenosis, with moderate left L5 foraminal narrowing. 3. Degenerative spondylolysis and facet hypertrophy at L4-5 with borderline spinal stenosis without impingement. 4. Small right foraminal disc protrusion at L1-2, closely approximating the exiting right L1 nerve root without frank impingement.

## 2019-02-22 NOTE — Telephone Encounter (Signed)
Pt is asking for a call from RN to discuss Dr Jannifer Franklin remarks about the MRI

## 2019-02-23 NOTE — Telephone Encounter (Signed)
I reached out to the pt and left a vm asking the pt to call me back. I used [place] interpreters to interpret the call.

## 2019-02-23 NOTE — Telephone Encounter (Signed)
Pt returned my call and we were able to discuss MRI report.  Pt declines EMG/NCS at this time but will call back if she would like to pursue.   Pt requested MRI report be sent to Dr. Hardin Negus office. Will send request to medical records.

## 2019-02-24 ENCOUNTER — Telehealth: Payer: Self-pay | Admitting: *Deleted

## 2019-02-24 NOTE — Telephone Encounter (Signed)
error 

## 2019-02-24 NOTE — Telephone Encounter (Signed)
Done report faxed to day. 

## 2019-03-31 ENCOUNTER — Emergency Department
Admission: EM | Admit: 2019-03-31 | Discharge: 2019-03-31 | Disposition: A | Discharge status: Home / Self Care | Payer: BLUE CROSS/BLUE SHIELD | Attending: Student | Admitting: Student

## 2019-03-31 ENCOUNTER — Emergency Department: Payer: BLUE CROSS/BLUE SHIELD

## 2019-03-31 ENCOUNTER — Other Ambulatory Visit: Payer: Self-pay

## 2019-03-31 ENCOUNTER — Encounter: Payer: Self-pay | Admitting: Emergency Medicine

## 2019-03-31 DIAGNOSIS — I10 Essential (primary) hypertension: Secondary | ICD-10-CM | POA: Diagnosis not present

## 2019-03-31 DIAGNOSIS — R1030 Lower abdominal pain, unspecified: Secondary | ICD-10-CM | POA: Insufficient documentation

## 2019-03-31 DIAGNOSIS — K5792 Diverticulitis of intestine, part unspecified, without perforation or abscess without bleeding: Secondary | ICD-10-CM

## 2019-03-31 DIAGNOSIS — Z7982 Long term (current) use of aspirin: Secondary | ICD-10-CM | POA: Insufficient documentation

## 2019-03-31 DIAGNOSIS — R110 Nausea: Secondary | ICD-10-CM | POA: Diagnosis not present

## 2019-03-31 DIAGNOSIS — Z79899 Other long term (current) drug therapy: Secondary | ICD-10-CM | POA: Diagnosis not present

## 2019-03-31 LAB — CBC
HCT: 40.3 % (ref 36.0–46.0)
Hemoglobin: 13.7 g/dL (ref 12.0–15.0)
MCH: 29.5 pg (ref 26.0–34.0)
MCHC: 34 g/dL (ref 30.0–36.0)
MCV: 86.7 fL (ref 80.0–100.0)
Platelets: 220 10*3/uL (ref 150–400)
RBC: 4.65 MIL/uL (ref 3.87–5.11)
RDW: 12 % (ref 11.5–15.5)
WBC: 14.1 10*3/uL — ABNORMAL HIGH (ref 4.0–10.5)
nRBC: 0 % (ref 0.0–0.2)

## 2019-03-31 LAB — COMPREHENSIVE METABOLIC PANEL
ALT: 27 U/L (ref 0–44)
AST: 27 U/L (ref 15–41)
Albumin: 4.2 g/dL (ref 3.5–5.0)
Alkaline Phosphatase: 72 U/L (ref 38–126)
Anion gap: 11 (ref 5–15)
BUN: 11 mg/dL (ref 8–23)
CO2: 25 mmol/L (ref 22–32)
Calcium: 9.8 mg/dL (ref 8.9–10.3)
Chloride: 101 mmol/L (ref 98–111)
Creatinine, Ser: 0.65 mg/dL (ref 0.44–1.00)
GFR calc Af Amer: >60 mL/min (ref >60)
GFR calc non Af Amer: >60 mL/min (ref >60)
Glucose, Bld: 108 mg/dL — ABNORMAL HIGH (ref 70–99)
Potassium: 3.4 mmol/L — ABNORMAL LOW (ref 3.5–5.1)
Sodium: 137 mmol/L (ref 135–145)
Total Bilirubin: 1.3 mg/dL — ABNORMAL HIGH (ref 0.3–1.2)
Total Protein: 7.4 g/dL (ref 6.5–8.1)

## 2019-03-31 LAB — LIPASE, BLOOD: Lipase: 61 U/L — ABNORMAL HIGH (ref 11–51)

## 2019-03-31 MED ORDER — METRONIDAZOLE 500 MG PO TABS: 500.0000 mg | ORAL_TABLET | Freq: Three times a day (TID) | ORAL | 0 refills | Status: DC

## 2019-03-31 MED ORDER — SODIUM CHLORIDE 0.9 % IV BOLUS
1000.0000 mL | Freq: Once | INTRAVENOUS | Status: AC
  Administered 2019-03-31: 1000 mL via INTRAVENOUS

## 2019-03-31 MED ORDER — CIPROFLOXACIN HCL 500 MG PO TABS
500.0000 mg | ORAL_TABLET | Freq: Once | ORAL | Status: AC
  Administered 2019-03-31: 500 mg via ORAL
  Filled 2019-03-31: qty 1

## 2019-03-31 MED ORDER — IOHEXOL 300 MG/ML  SOLN
100.0000 mL | Freq: Once | INTRAMUSCULAR | Status: AC | PRN
  Administered 2019-03-31: 100 mL via INTRAVENOUS

## 2019-03-31 MED ORDER — METRONIDAZOLE 500 MG PO TABS: 500.0000 mg | ORAL_TABLET | Freq: Three times a day (TID) | ORAL | 0 refills | Status: AC

## 2019-03-31 MED ORDER — ONDANSETRON HCL 4 MG PO TABS: 4.0000 mg | ORAL_TABLET | Freq: Every day | ORAL | 0 refills | Status: DC | PRN

## 2019-03-31 MED ORDER — ONDANSETRON HCL 4 MG/2ML IJ SOLN
4.0000 mg | Freq: Once | INTRAMUSCULAR | Status: AC
  Administered 2019-03-31: 4 mg via INTRAVENOUS
  Filled 2019-03-31: qty 2

## 2019-03-31 MED ORDER — CIPROFLOXACIN HCL 500 MG PO TABS: 500.0000 mg | ORAL_TABLET | Freq: Two times a day (BID) | ORAL | 0 refills | Status: AC

## 2019-03-31 MED ORDER — IOHEXOL 240 MG/ML SOLN
50.0000 mL | Freq: Once | INTRAMUSCULAR | Status: AC
  Administered 2019-03-31: 50 mL via ORAL

## 2019-03-31 MED ORDER — METRONIDAZOLE 500 MG PO TABS
500.0000 mg | ORAL_TABLET | Freq: Once | ORAL | Status: AC
  Administered 2019-03-31: 500 mg via ORAL
  Filled 2019-03-31: qty 1

## 2019-03-31 MED ORDER — FENTANYL CITRATE (PF) 100 MCG/2ML IJ SOLN
50.0000 ug | Freq: Once | INTRAMUSCULAR | Status: AC
  Administered 2019-03-31: 50 ug via INTRAVENOUS
  Filled 2019-03-31: qty 2

## 2019-03-31 MED ORDER — CIPROFLOXACIN HCL 500 MG PO TABS: 500.0000 mg | ORAL_TABLET | Freq: Two times a day (BID) | ORAL | 0 refills | Status: DC

## 2019-03-31 NOTE — ED Notes (Signed)
Pt leaving for CT.  

## 2019-03-31 NOTE — ED Provider Notes (Signed)
2:59 PM Assumed care for off going team.   Blood pressure 127/68, pulse 81, temperature 100.1 F (37.8 C), temperature source Oral, height 5\' 4"  (1.626 m), weight 87.1 kg, SpO2 96 %.  See their HPI for full report but in brief   3 days of lower abdominal pain worse on the left, low grade fevers, nausea. No vomiting/Diarhea.  Tender on the lower abdomin. Prior hysterectomy. CT scan pending.   White count noted to be 14.1.  Lipase slightly elevated.   Findings compatible with acute diverticulitis of the sigmoid colon.  No abscess or perforation.    4:33 PM reevaluated patient doing well.  Discussed with patient treatment including liquid diet and low fiber, antibiotics given her fever and white count, Zofran for her nausea at home.  Recommended continued treatment with Tylenol for her pain.  Discussed admission criteria versus going home.  Patient felt comfortable with being discharged home and would prefer to go home.  Will do p.o. trial.  Discussed with patient return precaution including fever and worse abdominal pain.   We will discharge patient on Flagyl and ciprofloxacin.  Discussed with patient that she should have follow-up colonoscopy in 6 weeks.  Patient tolerating PO, Given first dose of meds here.    I discussed the provisional nature of ED diagnosis, the treatment so far, the ongoing plan of care, follow up appointments and return precautions with the patient and any family or support people present. They expressed understanding and agreed with the plan, discharged home.       Vanessa [place], MD 03/31/19 [phone]

## 2019-03-31 NOTE — Discharge Instructions (Addendum)
Your CT scan showed diverticulitis. Liquid diet and bowel rest (low fiber foods) are most important. You will need follow up colonoscopy in 6 weeks. We are treating you with antibiotics Flagyl and ciprofloxacin.  You should complete this course. We will send you with nausea medicine called Zofran. You take 1 g of Tylenol every 8 hours.  If you are unable to tolerate p.o., having worsening pain or fevers you should return to the ER.

## 2019-03-31 NOTE — ED Provider Notes (Signed)
[facility] Emergency Department Provider Note  ____________________________________________    I have reviewed the triage vital signs and the nursing notes.  HISTORY  Chief Complaint Abdominal Pain    HPI Brandy Braun is a 63 y.o. female with a history of hypertension, hyperlipidemia, status post hysterectomy who presents to the emergency department for 3 days of lower abdominal pain and low-grade fever.  Pain localizes to the lower abdomen, left greater than right.  It has been worsening over the last 3 days.  Associated with nausea, but no vomiting.  She denies any diarrhea.  Had a normal bowel movement yesterday.  She denies any urinary symptoms.  No vaginal bleeding or discharge.  She denies any known history of diverticulitis.         Past Medical Hx Past Medical History:  Diagnosis Date  . Bilateral leg edema   . Carpal tunnel syndrome, bilateral   . Chronic foot pain   . Chronic low back pain   . Dysuria   . GERD (gastroesophageal reflux disease)   . High blood pressure   . High cholesterol   . Hypercalcemia   . Hypertension   . Lacunar stroke ([place])   . Obesity   . Stroke [facility])    Hx    Problem List Patient Active Problem List   Diagnosis Date Noted  . Edema of both lower extremities 11/11/2018  . HTN (hypertension) 09/28/2014  . HLD (hyperlipidemia) 09/28/2014    Past Surgical Hx Past Surgical History:  Procedure Laterality Date  . ABDOMINAL HYSTERECTOMY    . BREAST EXCISIONAL BIOPSY Left 90's   benign    Medications Prior to Admission medications   Medication Sig Start Date End Date Taking? Authorizing Provider  aspirin EC 81 MG tablet Take 81 mg by mouth daily.    [provider]  atorvastatin (LIPITOR) 10 MG tablet Take 1 tablet (10 mg total) by mouth daily. 09/28/14   Lance Bosch, NP  Elastic Bandages & Supports (JOBST KNEE HIGH COMPRESSION SM) MISC by Does not apply route.    [provider]   fluticasone (FLONASE) 50 MCG/ACT nasal spray Place 2 sprays into both nostrils daily.    [provider]  gabapentin (NEURONTIN) 100 MG capsule Take 1 capsule (100 mg total) by mouth 3 (three) times daily. 08/20/18 08/20/19  Earleen Newport, MD  hydrocortisone (ANUSOL-HC) 2.5 % rectal cream Place 1 application rectally 2 (two) times daily. 09/28/14   Lance Bosch, NP  lisinopril-hydrochlorothiazide (ZESTORETIC) 20-12.5 MG per tablet Take 1 tablet by mouth daily. 09/28/14   Lance Bosch, NP  pyridOXINE (VITAMIN B-6) 100 MG tablet Take 100 mg by mouth daily.    [provider]  Vitamin D, Ergocalciferol, (DRISDOL) 50000 UNITS CAPS capsule Take 1 capsule (50,000 Units total) by mouth every 7 (seven) days. 09/28/14   Lance Bosch, NP    Allergies Tramadol  Family Hx Family History  Problem Relation Age of Onset  . Stroke Mother 32  . Kidney disease Father 53       MI  . Heart disease Father   . Breast cancer Sister 12  . High Cholesterol Neg Hx     Social Hx Social History   Tobacco Use  . Smoking status: Never Smoker  . Smokeless tobacco: Never Used  Substance Use Topics  . Alcohol use: No    Alcohol/week: 0.0 standard drinks  . Drug use: No    Review of Systems  Constitutional: + for fever. Negative for chills. Eyes: Negative for visual changes. ENT: Negative for sore throat. Cardiovascular: Negative for chest pain. Respiratory: Negative for shortness of breath. Gastrointestinal: + for abdominal pain. + for nausea. Negative for vomiting. Genitourinary: Negative for dysuria. Musculoskeletal: Negative for leg swelling. Skin: Negative for rash. Neurological: Negative for for headaches.  ____________________________________________   PHYSICAL EXAM:  Vital Signs: ED Triage Vitals  Enc Vitals Group     BP 03/31/19 1300 127/68     Pulse Rate 03/31/19 1300 81     Resp --      Temp 03/31/19 1300 100.1 F (37.8 C)     Temp Source 03/31/19  1300 Oral     SpO2 03/31/19 1300 96 %     Weight 03/31/19 1300 192 lb (87.1 kg)     Height 03/31/19 1300 5\' 4"  (1.626 m)     Head Circumference --      Peak Flow --      Pain Score 03/31/19 1304 10     Pain Loc --      Pain Edu? --      Excl. in GC? --     Constitutional: Alert and oriented.  Appears uncomfortable. Eyes: Conjunctivae are normal. Sclera anicteric. Head: Normocephalic. Atraumatic. Nose: No congestion. No rhinorrhea. Mouth/Throat: Mucous membranes are moist.  Neck: No stridor.   Cardiovascular: Normal rate, regular rhythm. Grossly normal heart sounds. Extremities well perfused. Respiratory: Normal respiratory effort.  Lungs CTAB. Gastrointestinal: Soft.  Nondistended.  Tender to palpation across the lower abdomen, more focal in the left lower quadrant compared to the right.  No rebound or guarding. Musculoskeletal: No lower extremity edema. Neurologic:  Normal speech and language. No gross focal neurologic deficits are appreciated.  Skin: Skin is warm, dry and intact. No rash noted. Psychiatric: Mood and affect are appropriate for situation.  ____________________________________________  EKG  N/A  ____________________________________________  RADIOLOGY  Pending.    ____________________________________________   PROCEDURES  Procedure(s) performed (including Critical Care):  Procedures   ____________________________________________   INITIAL IMPRESSION / ASSESSMENT AND PLAN / ED COURSE  63 y.o. female with history of hypertension, hyperlipidemia, status post hysterectomy who presents to the ED for 3 days of lower abdominal pain, left greater than right, as well as low-grade fever.  On exam, temperature of 100.1 Fahrenheit.  Looks uncomfortable.  Tender to palpation across the lower abdomen, left greater than right.  Concern for diverticulitis, potential early colitis.  Consider obstruction given her history of hysterectomy, however given her  normal bowel movement yesterday this is less likely.    Labs notable for leukocytosis.  Will obtain CT scan for further evaluation, symptom control.  3:30 PM Patient signed out to oncoming MD due to shift change, pending imaging and reassessment.   ____________________________________________   FINAL CLINICAL IMPRESSION(S) / ED DIAGNOSES  Final diagnoses:  Lower abdominal pain  Nausea       Note:  This document was prepared using Dragon voice recognition software and may include unintentional dictation errors.   Miguel AschoffMonks,  L., MD 03/31/19 1535

## 2019-03-31 NOTE — ED Triage Notes (Addendum)
Here for lower abdominal pain worse on LLQ but across waist line.  Denies NVD.  Low grade temp in triage.  Pain X 3 days. No hx of same. No urinary sx. No hx diverticulitis

## 2019-03-31 NOTE — ED Notes (Signed)
EDP [facility]ke verbal to d/c UA order.

## 2019-03-31 NOTE — ED Notes (Signed)
Pt signed printed d/c paperwork as topaz froze.  

## 2019-03-31 NOTE — ED Notes (Signed)
Patient transported to CT 

## 2019-03-31 NOTE — ED Notes (Signed)
EDP Funke updating pt & family at bedside.

## 2019-03-31 NOTE — ED Notes (Signed)
Pt agrees to let this RN know when she can provide urine sample.

## 2019-08-26 ENCOUNTER — Other Ambulatory Visit: Payer: Self-pay

## 2019-08-26 ENCOUNTER — Emergency Department
Admission: EM | Admit: 2019-08-26 | Discharge: 2019-08-26 | Disposition: A | Discharge status: Home / Self Care | Payer: BLUE CROSS/BLUE SHIELD | Attending: Student in an Organized Health Care Education/Training Program | Admitting: Student in an Organized Health Care Education/Training Program

## 2019-08-26 ENCOUNTER — Emergency Department: Payer: BLUE CROSS/BLUE SHIELD

## 2019-08-26 ENCOUNTER — Encounter: Payer: Self-pay | Admitting: Emergency Medicine

## 2019-08-26 DIAGNOSIS — R112 Nausea with vomiting, unspecified: Secondary | ICD-10-CM

## 2019-08-26 DIAGNOSIS — U071 COVID-19: Secondary | ICD-10-CM | POA: Diagnosis not present

## 2019-08-26 DIAGNOSIS — Z8673 Personal history of transient ischemic attack (TIA), and cerebral infarction without residual deficits: Secondary | ICD-10-CM | POA: Insufficient documentation

## 2019-08-26 DIAGNOSIS — Z79899 Other long term (current) drug therapy: Secondary | ICD-10-CM | POA: Diagnosis not present

## 2019-08-26 DIAGNOSIS — R197 Diarrhea, unspecified: Secondary | ICD-10-CM | POA: Insufficient documentation

## 2019-08-26 DIAGNOSIS — I10 Essential (primary) hypertension: Secondary | ICD-10-CM | POA: Diagnosis not present

## 2019-08-26 DIAGNOSIS — Z7982 Long term (current) use of aspirin: Secondary | ICD-10-CM | POA: Insufficient documentation

## 2019-08-26 LAB — BASIC METABOLIC PANEL
Anion gap: 11 (ref 5–15)
BUN: 12 mg/dL (ref 8–23)
CO2: 28 mmol/L (ref 22–32)
Calcium: 9.6 mg/dL (ref 8.9–10.3)
Chloride: 101 mmol/L (ref 98–111)
Creatinine, Ser: 0.7 mg/dL (ref 0.44–1.00)
GFR calc Af Amer: >60 mL/min (ref >60)
GFR calc non Af Amer: >60 mL/min (ref >60)
Glucose, Bld: 104 mg/dL — ABNORMAL HIGH (ref 70–99)
Potassium: 3.7 mmol/L (ref 3.5–5.1)
Sodium: 140 mmol/L (ref 135–145)

## 2019-08-26 LAB — TROPONIN I (HIGH SENSITIVITY)
Troponin I (High Sensitivity): 3 ng/L (ref <18)
Troponin I (High Sensitivity): 4 ng/L (ref <18)

## 2019-08-26 LAB — CBC
HCT: 43.2 % (ref 36.0–46.0)
Hemoglobin: 14.3 g/dL (ref 12.0–15.0)
MCH: 28.4 pg (ref 26.0–34.0)
MCHC: 33.1 g/dL (ref 30.0–36.0)
MCV: 85.7 fL (ref 80.0–100.0)
Platelets: 204 10*3/uL (ref 150–400)
RBC: 5.04 MIL/uL (ref 3.87–5.11)
RDW: 11.7 % (ref 11.5–15.5)
WBC: 6.1 10*3/uL (ref 4.0–10.5)
nRBC: 0 % (ref 0.0–0.2)

## 2019-08-26 MED ORDER — SODIUM CHLORIDE 0.9 % IV BOLUS
500.0000 mL | Freq: Once | INTRAVENOUS | Status: AC
  Administered 2019-08-26: 500 mL via INTRAVENOUS

## 2019-08-26 MED ORDER — AZITHROMYCIN 500 MG PO TABS
500.0000 mg | ORAL_TABLET | Freq: Once | ORAL | Status: AC
  Administered 2019-08-26: 500 mg via ORAL
  Filled 2019-08-26: qty 1

## 2019-08-26 MED ORDER — ONDANSETRON 4 MG PO TBDP
4.0000 mg | ORAL_TABLET | Freq: Once | ORAL | Status: AC
  Administered 2019-08-26: 4 mg via ORAL
  Filled 2019-08-26: qty 1

## 2019-08-26 MED ORDER — AZITHROMYCIN 500 MG PO TABS: 500.0000 mg | ORAL_TABLET | Freq: Every day | ORAL | 0 refills | Status: AC

## 2019-08-26 MED ORDER — ONDANSETRON HCL 4 MG PO TABS: 4.0000 mg | ORAL_TABLET | Freq: Every day | ORAL | 0 refills | Status: AC | PRN

## 2019-08-26 MED ORDER — CEPHALEXIN 500 MG PO CAPS
500.0000 mg | ORAL_CAPSULE | Freq: Once | ORAL | Status: AC
  Administered 2019-08-26: 18:00:00 500 mg via ORAL
  Filled 2019-08-26: qty 1

## 2019-08-26 NOTE — ED Notes (Signed)
Pt states she cannot take po antibiotics now because her stomach is upset.

## 2019-08-26 NOTE — ED Notes (Signed)
Pt refused wheelchair, pt ambulating in room, denies dizziness. Pt ambulated to lobby.

## 2019-08-26 NOTE — ED Notes (Signed)
Pt resting o2 rate of 97% RA, Ambulating o2 rate of 97% RA.

## 2019-08-26 NOTE — ED Notes (Signed)
Pt states she is unable to urinate at present.  

## 2019-08-26 NOTE — ED Notes (Signed)
Pt states she is not feeling as nauseated. Eating saltines and drinking water at present.

## 2019-08-26 NOTE — ED Triage Notes (Signed)
Pt was called today and told she had covid.  Here for vomiting. Emesis X 2 today.  Unlabored at this time.  C/o headache, body aches, vomiting, chest tightness and SHOB.  Ambulatory.  NAD at this time. Pt sx started 10 days ago

## 2019-08-26 NOTE — ED Provider Notes (Signed)
[facility] Emergency Department Provider Note    First MD Initiated Contact with Patient 08/26/19 1558     (approximate)  I have reviewed the triage vital signs and the nursing notes.   HISTORY  Chief Complaint Emesis    HPI Adine MaduraMaida Braun is a 63 y.o. female symptoms as described above with recent diagnosis of COVID-19 presenting to the ER for nausea vomiting decreased p.o. intake and episode of loose nonbloody stool.  States that she feels generalized weakness and fatigue.  Does not have any history of lung disease.  Has not been on any antibiotics or steroids.  Primary complaint is nausea decreased p.o. intake.    Past Medical History:  Diagnosis Date  . Bilateral leg edema   . Carpal tunnel syndrome, bilateral   . Chronic foot pain   . Chronic low back pain   . Dysuria   . GERD (gastroesophageal reflux disease)   . High blood pressure   . High cholesterol   . Hypercalcemia   . Hypertension   . Lacunar stroke (HCC)   . Obesity   . Stroke [facility](HCC)    Hx   Family History  Problem Relation Age of Onset  . Stroke Mother 3639  . Kidney disease Father [id number]       MI  . Heart disease Father   . Breast cancer Sister 7355  . High Cholesterol Neg Hx    Past Surgical History:  Procedure Laterality Date  . ABDOMINAL HYSTERECTOMY    . BREAST EXCISIONAL BIOPSY Left 90's   benign   Patient Active Problem List   Diagnosis Date Noted  . Edema of both lower extremities 11/11/2018  . HTN (hypertension) 09/28/2014  . HLD (hyperlipidemia) 09/28/2014      Prior to Admission medications   Medication Sig Start Date End Date Taking? Authorizing Provider  aspirin EC 81 MG tablet Take 81 mg by mouth daily.    [provider]  atorvastatin (LIPITOR) 10 MG tablet Take 1 tablet (10 mg total) by mouth daily. 09/28/14   Ambrose FinlandKeck, Valerie A, NP  azithromycin (ZITHROMAX) 500 MG tablet Take 1 tablet (500 mg total) by mouth daily for 3 days. Take 1 tablet daily for  3 days. 08/26/19 08/29/19  Willy Eddyobinson, Seith Aikey, MD  Elastic Bandages & Supports (JOBST KNEE HIGH COMPRESSION SM) MISC by Does not apply route.    [provider]  fluticasone (FLONASE) 50 MCG/ACT nasal spray Place 2 sprays into both nostrils daily.    [provider]  gabapentin (NEURONTIN) 100 MG capsule Take 1 capsule (100 mg total) by mouth 3 (three) times daily. 08/20/18 08/20/19  Emily FilbertWilliams, Jonathan E, MD  hydrocortisone (ANUSOL-HC) 2.5 % rectal cream Place 1 application rectally 2 (two) times daily. 09/28/14   Ambrose FinlandKeck, Valerie A, NP  lisinopril-hydrochlorothiazide (ZESTORETIC) 20-12.5 MG per tablet Take 1 tablet by mouth daily. 09/28/14   Ambrose FinlandKeck, Valerie A, NP  ondansetron (ZOFRAN) 4 MG tablet Take 1 tablet (4 mg total) by mouth daily as needed. 08/26/19 08/25/20  Willy Eddyobinson, Dianna Ewald, MD  pyridOXINE (VITAMIN B-6) 100 MG tablet Take 100 mg by mouth daily.    [provider]  Vitamin D, Ergocalciferol, (DRISDOL) 50000 UNITS CAPS capsule Take 1 capsule (50,000 Units total) by mouth every 7 (seven) days. 09/28/14   Ambrose FinlandKeck, Valerie A, NP    Allergies Tramadol    Social History Social History   Tobacco Use  . Smoking status: Never Smoker  . Smokeless tobacco: Never Used  Substance  Use Topics  . Alcohol use: No    Alcohol/week: 0.0 standard drinks  . Drug use: No    Review of Systems Patient denies headaches, rhinorrhea, blurry vision, numbness, shortness of breath, chest pain, edema, cough, abdominal pain, nausea, vomiting, diarrhea, dysuria, fevers, rashes or hallucinations unless otherwise stated above in HPI. ____________________________________________   PHYSICAL EXAM:  VITAL SIGNS: Vitals:   08/26/19 1737 08/26/19 1829  BP:  127/62  Pulse: 82 95  Resp: 18 18  Temp:  98.1 F (36.7 C)  SpO2: 98% 100%    Constitutional: Alert and oriented.  Eyes: Conjunctivae are normal.  Head: Atraumatic. Nose: No congestion/rhinnorhea. Mouth/Throat: Mucous membranes are  moist.   Neck: No stridor. Painless ROM.  Cardiovascular: Normal rate, regular rhythm. Grossly normal heart sounds.  Good peripheral circulation. Respiratory: Normal respiratory effort, mild tachypnea  No retractions. Lungs with coarse bibasilar bs Gastrointestinal: Soft and nontender. No distention. No abdominal bruits. No CVA tenderness. Genitourinary:  Musculoskeletal: No lower extremity tenderness nor edema.  No joint effusions. Neurologic:  Normal speech and language. No gross focal neurologic deficits are appreciated. No facial droop Skin:  Skin is warm, dry and intact. No rash noted. Psychiatric: Mood and affect are normal. Speech and behavior are normal.  ____________________________________________   LABS (all labs ordered are listed, but only abnormal results are displayed)  Results for orders placed or performed during the hospital encounter of 08/26/19 (from the past 24 hour(s))  Basic metabolic panel     Status: Abnormal   Collection Time: 08/26/19  2:41 PM  Result Value Ref Range   Sodium 140 135 - 145 mmol/L   Potassium 3.7 3.5 - 5.1 mmol/L   Chloride 101 98 - 111 mmol/L   CO2 28 22 - 32 mmol/L   Glucose, Bld 104 (H) 70 - 99 mg/dL   BUN 12 8 - 23 mg/dL   Creatinine, Ser 2.26 0.44 - 1.00 mg/dL   Calcium 9.6 8.9 - 33.3 mg/dL   GFR calc non Af Amer >60 >60 mL/min   GFR calc Af Amer >60 >60 mL/min   Anion gap 11 5 - 15  CBC     Status: None   Collection Time: 08/26/19  2:41 PM  Result Value Ref Range   WBC 6.1 4.0 - 10.5 K/uL   RBC 5.04 3.87 - 5.11 MIL/uL   Hemoglobin 14.3 12.0 - 15.0 g/dL   HCT 54.5 62.5 - 63.8 %   MCV 85.7 80.0 - 100.0 fL   MCH 28.4 26.0 - 34.0 pg   MCHC 33.1 30.0 - 36.0 g/dL   RDW 93.7 34.2 - 87.6 %   Platelets 204 150 - 400 K/uL   nRBC 0.0 0.0 - 0.2 %  Troponin I (High Sensitivity)     Status: None   Collection Time: 08/26/19  2:41 PM  Result Value Ref Range   Troponin I (High Sensitivity) 3 <18 ng/L  Troponin I (High Sensitivity)      Status: None   Collection Time: 08/26/19  5:19 PM  Result Value Ref Range   Troponin I (High Sensitivity) 4 <18 ng/L   ____________________________________________  EKG My review and personal interpretation at Time: 14:41   Indication: Nausea  Rate: 80  Rhythm: sinus Axis: normal Other: nonspecific t wave inversions, no stemi ____________________________________________  RADIOLOGY  I personally reviewed all radiographic images ordered to evaluate for the above acute complaints and reviewed radiology reports and findings.  These findings were personally discussed with the patient.  Please see medical record for radiology report.  ____________________________________________   PROCEDURES  Procedure(s) performed:  Procedures    Critical Care performed: no ____________________________________________   INITIAL IMPRESSION / ASSESSMENT AND PLAN / ED COURSE  Pertinent labs & imaging results that were available during my care of the patient were reviewed by me and considered in my medical decision making (see chart for details).   DDX: Pneumonia, COVID-19, dehydration, enteritis, gastritis, electrolyte abnormality, sepsis  Kashonda Sarkisyan is a 63 y.o. who presents to the ED with symptoms as described above.  Patient nontoxic-appearing.  Recent Covid diagnosis suggesting that her symptoms are secondary.  Abdominal exam benign.  No hypoxia but will observe on cardiac monitor as well as pulse ox to make sure she is not having intermittent desaturations.  Chest x-ray does show some peripheral infiltrates concerning for pneumonia versus pneumonitis.  Suspect this more likely secondary to COVID-19.  Give antiemetic fluid bolus and reassess.  Clinical Course as of Aug 25 1848  Thu Aug 26, 2019  1812 Patient reassessed.  She is not having any tachypnea or hypoxia.  She is speaking in complete sentences.  Symptoms did improve with antiemetic.  Given her infiltrate will give course of  antibiotic possible community-acquired pneumonia though symptoms seem more consistent with COVID-19.  Does not seem clinically consistent with PE given lack of tachycardia or hypoxia.  At this point do believe patient clinically stable appropriate for outpatient follow-up.   Will refer to outpatient infusion center at [facility].  We discussed strict return precautions and signs and symptoms for which she should return.  Have discussed with the patient and available family all diagnostics and treatments performed thus far and all questions were answered to the best of my ability. The patient demonstrates understanding and agreement with plan.    [PR]    Clinical Course User Index [PR] Merlyn Lot, MD    The patient was evaluated in Emergency Department today for the symptoms described in the history of present illness. He/she was evaluated in the context of the global COVID-19 pandemic, which necessitated consideration that the patient might be at risk for infection with the SARS-CoV-2 virus that causes COVID-19. Institutional protocols and algorithms that pertain to the evaluation of patients at risk for COVID-19 are in a state of rapid change based on information released by regulatory bodies including the CDC and federal and state organizations. These policies and algorithms were followed during the patient's care in the ED.  As part of my medical decision making, I reviewed the following data within the [place] notes reviewed and incorporated, Labs reviewed, notes from prior ED visits and [place] Controlled Substance Database   ____________________________________________   FINAL CLINICAL IMPRESSION(S) / ED DIAGNOSES  Final diagnoses:  Nausea vomiting and diarrhea  COVID-19 virus infection      NEW MEDICATIONS STARTED DURING THIS VISIT:  Discharge Medication List as of 08/26/2019  6:16 PM    START taking these medications   Details  azithromycin (ZITHROMAX)  500 MG tablet Take 1 tablet (500 mg total) by mouth daily for 3 days. Take 1 tablet daily for 3 days., Starting Thu 08/26/2019, Until Sun 08/29/2019, Normal         Note:  This document was prepared using Dragon voice recognition software and may include unintentional dictation errors.    Merlyn Lot, MD 08/26/19 [phone]

## 2019-08-26 NOTE — ED Notes (Signed)
Patient states she is unable to void at this time.  

## 2019-08-27 ENCOUNTER — Telehealth: Payer: Self-pay | Admitting: Unknown Physician Specialty

## 2019-08-27 ENCOUNTER — Other Ambulatory Visit: Payer: Self-pay | Admitting: Unknown Physician Specialty

## 2019-08-27 DIAGNOSIS — U071 COVID-19: Secondary | ICD-10-CM

## 2019-08-27 NOTE — Telephone Encounter (Signed)
  I connected by phone with Brandy Braun on 08/27/2019 at 10:59 AM to discuss the potential use of an new treatment for mild to moderate COVID-19 viral infection in non-hospitalized patients.  This patient is a 64 y.o. female that meets the FDA criteria for Emergency Use Authorization of bamlanivimab or casirivimab\imdevimab.  Has a (+) direct SARS-CoV-2 viral test result  Has mild or moderate COVID-19   Is ? 62 years of age and weighs ? 40 kg  Is NOT hospitalized due to COVID-19  Is NOT requiring oxygen therapy or requiring an increase in baseline oxygen flow rate due to COVID-19  Is within 10 days of symptom onset  Has at least one of the high risk factor(s) for progression to severe COVID-19 and/or hospitalization as defined in EUA.  Specific high risk criteria :55 and over and Hypertension   I have spoken and communicated the following to the patient or parent/caregiver:  1. FDA has authorized the emergency use of bamlanivimab and casirivimab\imdevimab for the treatment of mild to moderate COVID-19 in adults and pediatric patients with positive results of direct SARS-CoV-2 viral testing who are 53 years of age and older weighing at least 40 kg, and who are at high risk for progressing to severe COVID-19 and/or hospitalization.  2. The significant known and potential risks and benefits of bamlanivimab and casirivimab\imdevimab, and the extent to which such potential risks and benefits are unknown.  3. Information on available alternative treatments and the risks and benefits of those alternatives, including clinical trials.  4. Patients treated with bamlanivimab and casirivimab\imdevimab should continue to self-isolate and use infection control measures (e.g., wear mask, isolate, social distance, avoid sharing personal items, clean and disinfect "high touch" surfaces, and frequent handwashing) according to CDC guidelines.   5. The patient or parent/caregiver has the option to accept  or refuse bamlanivimab or casirivimab\imdevimab .  After reviewing this information with the patient, The patient agreed to proceed with receiving the casirivimab\imdevimab infusion and will be provided a copy of the Fact sheet prior to receiving the infusion.Gabriel Cirri 08/27/2019 10:59 AM  Symptom onset 12/29- cough, aching,nausea, vomiting

## 2019-08-30 MED FILL — ONDANSETRON HCL 4 MG TABLET: 4 | 14 days supply | Qty: 14 | Fill #0

## 2019-08-30 MED FILL — AZITHROMYCIN 500 MG TABLET: 500 | 3 days supply | Qty: 3 | Fill #0

## 2019-09-01 ENCOUNTER — Ambulatory Visit (HOSPITAL_COMMUNITY): Payer: BLUE CROSS/BLUE SHIELD

## 2020-05-28 IMAGING — MG DIGITAL SCREENING BILATERAL MAMMOGRAM WITH TOMO AND CAD
6 of 10 series · 6 of 30 positions shown · non-contrast
Comparison: Previous exam(s).

CLINICAL DATA: Screening. History of LEFT breast excisional biopsy
in [REDACTED].

EXAM:
DIGITAL SCREENING BILATERAL MAMMOGRAM WITH TOMO AND CAD

[L CV synth-2D]
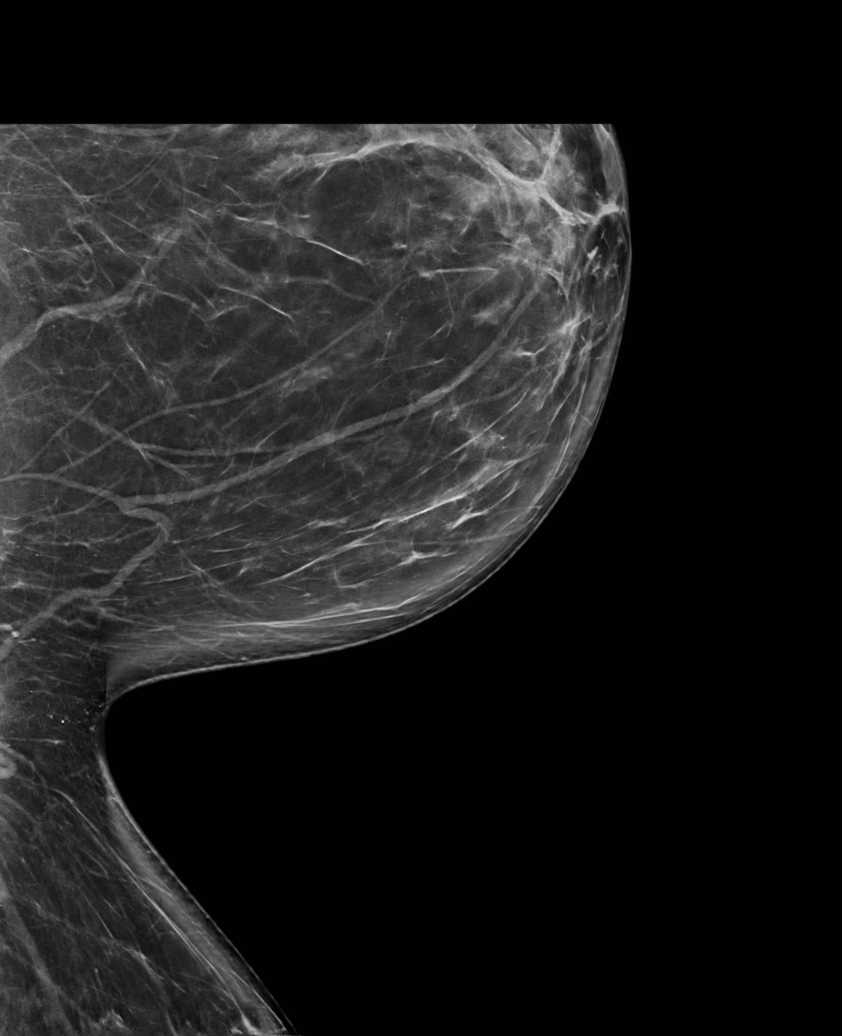

[L MLO synth-2D]
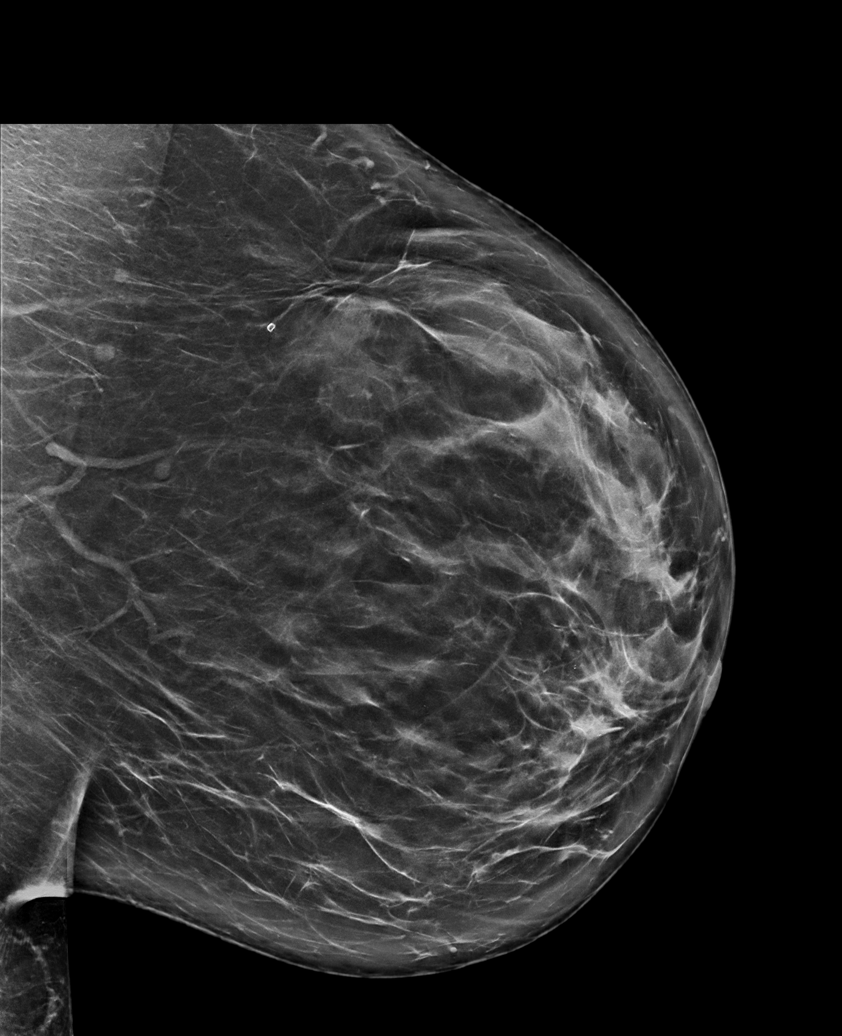

[R CC synth-2D]
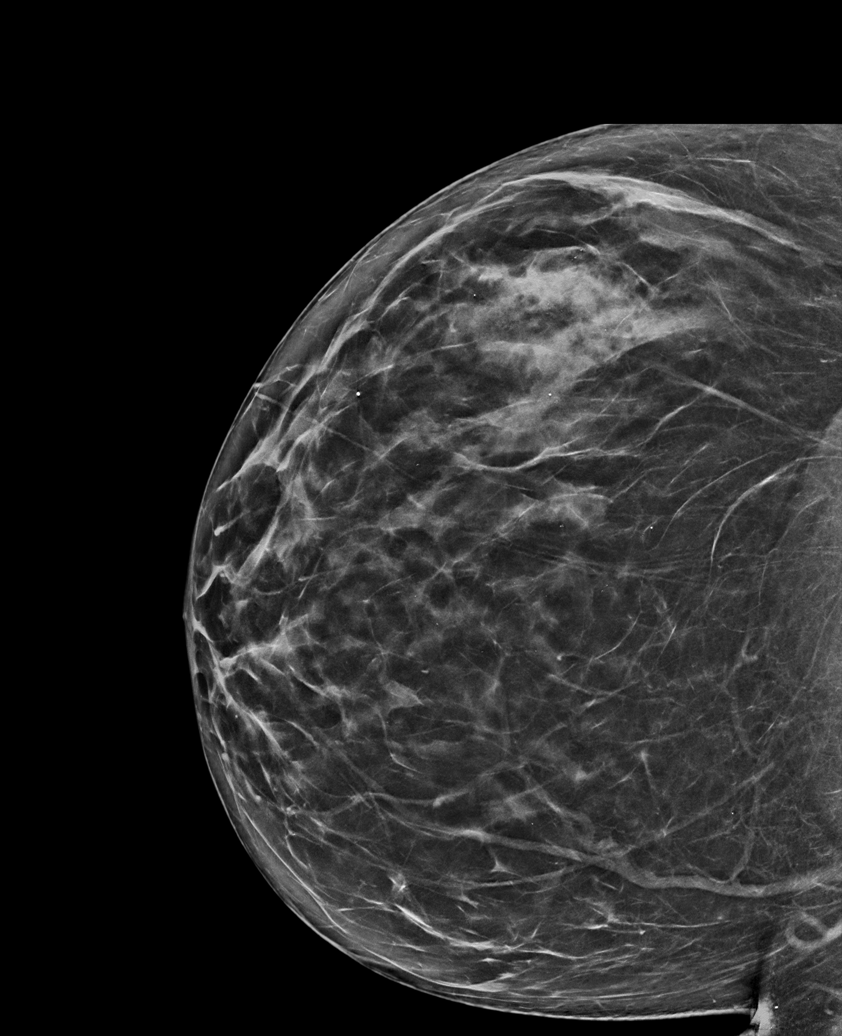

[L CC synth-2D]
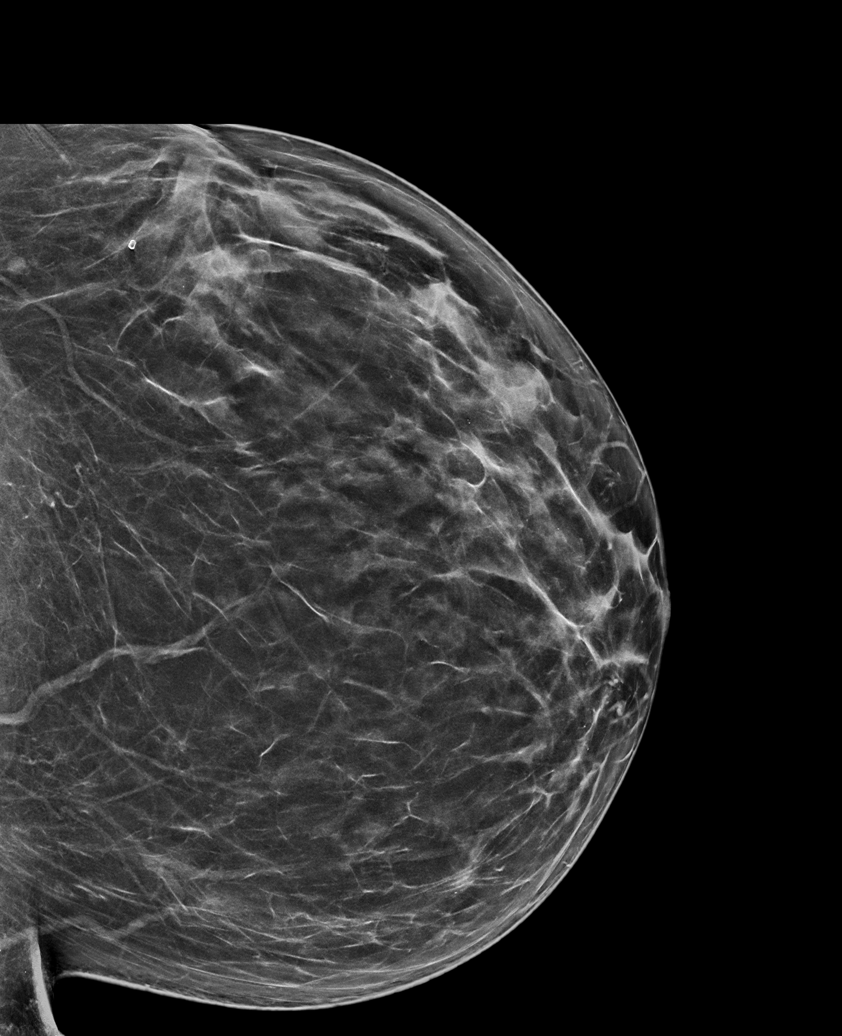

[R MLO synth-2D]
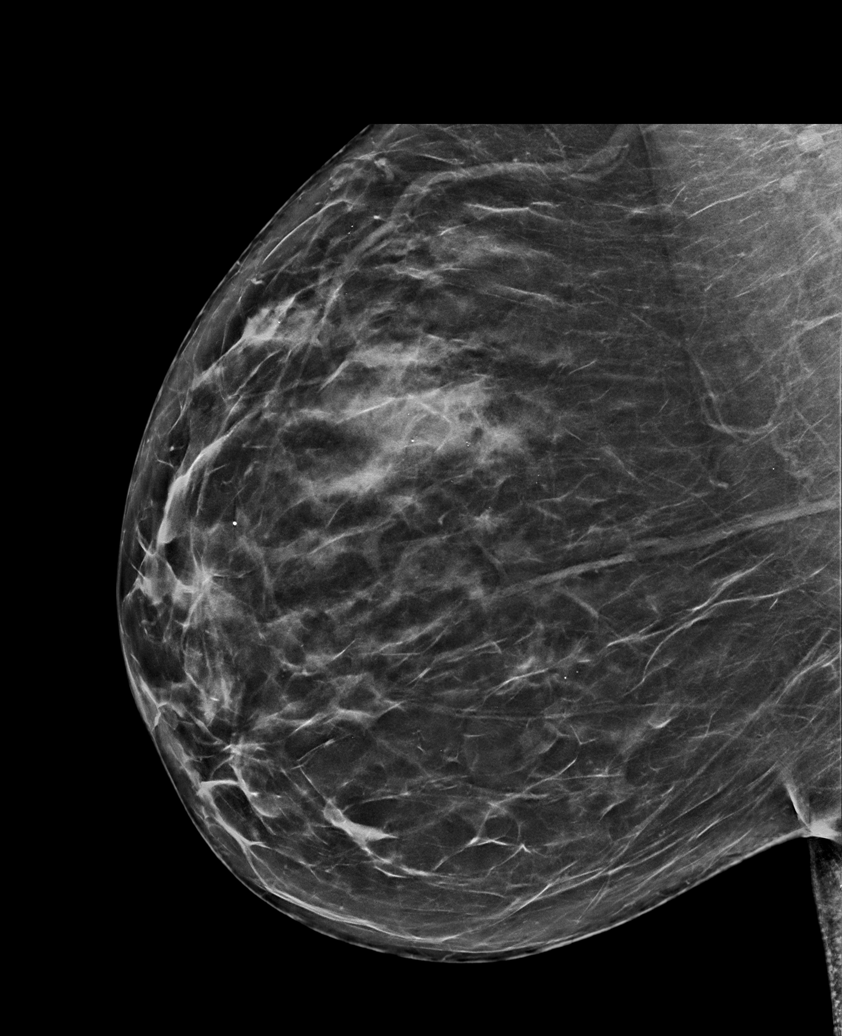

[R CC tomo · tomo slice 40/79.0]
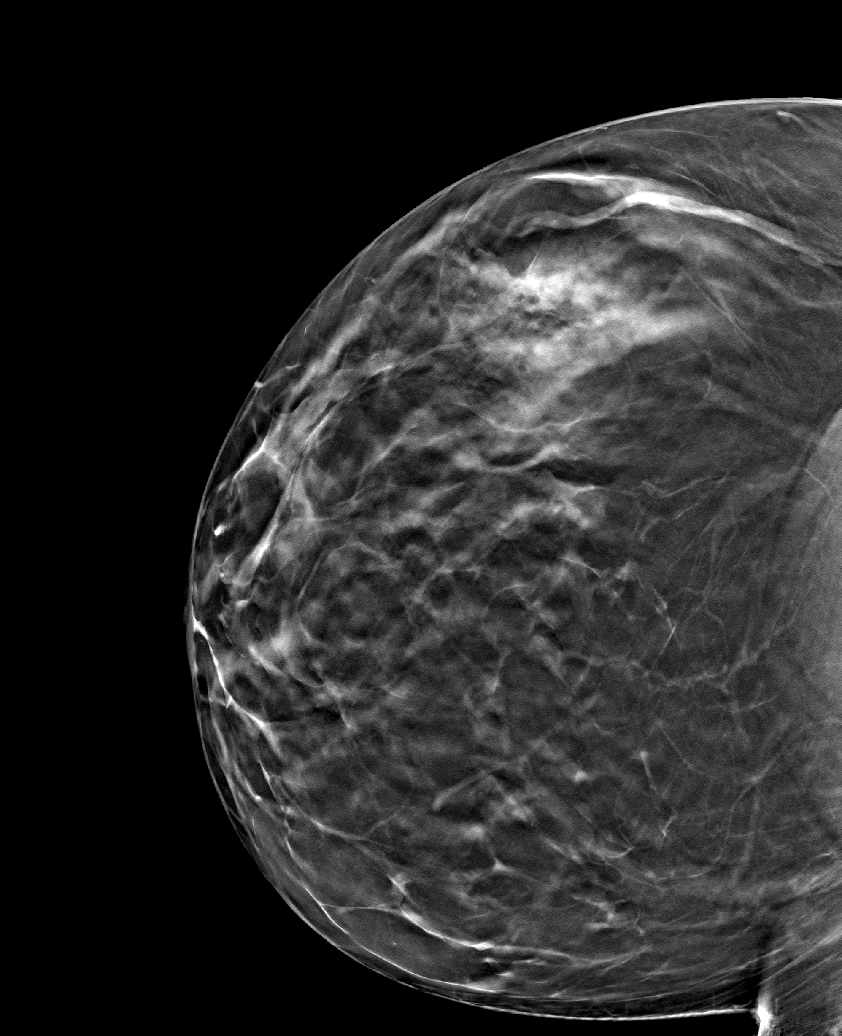

[6 of 30 positions shown; findings below may reference images not displayed]

ACR Breast Density Category c: The breast tissue is heterogeneously
dense, which may obscure small masses.
FINDINGS: There are no findings suspicious for malignancy. Images were
processed with CAD.
IMPRESSION: No mammographic evidence of malignancy. A result letter of this
screening mammogram will be mailed directly to the patient.

RECOMMENDATION:
Screening mammogram in one year. (Code:PA-7-GRM)

BI-RADS CATEGORY  1: Negative.

## 2020-09-09 ENCOUNTER — Emergency Department: Payer: BLUE CROSS/BLUE SHIELD

## 2020-09-09 ENCOUNTER — Encounter: Payer: Self-pay | Admitting: *Deleted

## 2020-09-09 ENCOUNTER — Emergency Department
Admission: EM | Admit: 2020-09-09 | Discharge: 2020-09-09 | Disposition: A | Discharge status: Home / Self Care | Payer: BLUE CROSS/BLUE SHIELD | Attending: Emergency Medicine | Admitting: Emergency Medicine

## 2020-09-09 DIAGNOSIS — Z7982 Long term (current) use of aspirin: Secondary | ICD-10-CM | POA: Insufficient documentation

## 2020-09-09 DIAGNOSIS — Z79899 Other long term (current) drug therapy: Secondary | ICD-10-CM | POA: Insufficient documentation

## 2020-09-09 DIAGNOSIS — K5732 Diverticulitis of large intestine without perforation or abscess without bleeding: Secondary | ICD-10-CM | POA: Diagnosis not present

## 2020-09-09 DIAGNOSIS — R1031 Right lower quadrant pain: Secondary | ICD-10-CM | POA: Diagnosis present

## 2020-09-09 DIAGNOSIS — I10 Essential (primary) hypertension: Secondary | ICD-10-CM | POA: Diagnosis not present

## 2020-09-09 DIAGNOSIS — K5792 Diverticulitis of intestine, part unspecified, without perforation or abscess without bleeding: Secondary | ICD-10-CM

## 2020-09-09 LAB — URINALYSIS, COMPLETE (UACMP) WITH MICROSCOPIC
Bacteria, UA: NONE SEEN
Bilirubin Urine: NEGATIVE
Glucose, UA: NEGATIVE mg/dL
Hgb urine dipstick: NEGATIVE
Ketones, ur: NEGATIVE mg/dL
Leukocytes,Ua: NEGATIVE
Nitrite: NEGATIVE
Protein, ur: NEGATIVE mg/dL
Specific Gravity, Urine: 1.017 (ref 1.005–1.030)
Squamous Epithelial / HPF: NONE SEEN (ref 0–5)
pH: 7 (ref 5.0–8.0)

## 2020-09-09 LAB — COMPREHENSIVE METABOLIC PANEL
ALT: 24 U/L (ref 0–44)
AST: 22 U/L (ref 15–41)
Albumin: 4.1 g/dL (ref 3.5–5.0)
Alkaline Phosphatase: 72 U/L (ref 38–126)
Anion gap: 10 (ref 5–15)
BUN: 13 mg/dL (ref 8–23)
CO2: 27 mmol/L (ref 22–32)
Calcium: 9.8 mg/dL (ref 8.9–10.3)
Chloride: 101 mmol/L (ref 98–111)
Creatinine, Ser: 0.75 mg/dL (ref 0.44–1.00)
GFR, Estimated: >60 mL/min (ref >60)
Glucose, Bld: 99 mg/dL (ref 70–99)
Potassium: 3.8 mmol/L (ref 3.5–5.1)
Sodium: 138 mmol/L (ref 135–145)
Total Bilirubin: 0.7 mg/dL (ref 0.3–1.2)
Total Protein: 7.2 g/dL (ref 6.5–8.1)

## 2020-09-09 LAB — CBC
HCT: 41.3 % (ref 36.0–46.0)
Hemoglobin: 13.9 g/dL (ref 12.0–15.0)
MCH: 29.3 pg (ref 26.0–34.0)
MCHC: 33.7 g/dL (ref 30.0–36.0)
MCV: 87.1 fL (ref 80.0–100.0)
Platelets: 218 10*3/uL (ref 150–400)
RBC: 4.74 MIL/uL (ref 3.87–5.11)
RDW: 12.1 % (ref 11.5–15.5)
WBC: 10.3 10*3/uL (ref 4.0–10.5)
nRBC: 0 % (ref 0.0–0.2)

## 2020-09-09 LAB — LIPASE, BLOOD: Lipase: 30 U/L (ref 11–51)

## 2020-09-09 MED ORDER — KETOROLAC TROMETHAMINE 30 MG/ML IJ SOLN
15.0000 mg | Freq: Once | INTRAMUSCULAR | Status: AC
  Administered 2020-09-09: 15 mg via INTRAVENOUS
  Filled 2020-09-09: qty 1

## 2020-09-09 MED ORDER — AMOXICILLIN-POT CLAVULANATE 875-125 MG PO TABS: 1.0000 | ORAL_TABLET | Freq: Two times a day (BID) | ORAL | 0 refills | Status: AC

## 2020-09-09 MED ORDER — AMOXICILLIN-POT CLAVULANATE 875-125 MG PO TABS
1.0000 | ORAL_TABLET | Freq: Once | ORAL | Status: AC
  Administered 2020-09-09: 1 via ORAL
  Filled 2020-09-09: qty 1

## 2020-09-09 MED ORDER — IOHEXOL 300 MG/ML  SOLN
100.0000 mL | Freq: Once | INTRAMUSCULAR | Status: AC | PRN
  Administered 2020-09-09: 100 mL via INTRAVENOUS
  Filled 2020-09-09: qty 100

## 2020-09-09 NOTE — ED Provider Notes (Signed)
[facility] Emergency Department Provider Note   ____________________________________________   Event Date/Time   First MD Initiated Contact with Patient 09/09/20 1222     (approximate)  I have reviewed the triage vital signs and the nursing notes.   HISTORY  Chief Complaint Abdominal Pain    HPI Brandy Braun is a 65 y.o. female with past medical history of hypertension, hyperlipidemia, stroke, and GERD who presents to the ED complaining of abdominal pain.  Patient reports that she has been having approximately 1 week of bilateral lower quadrant abdominal pain.  She describes the pain as sharp and exacerbated when she goes to eat or drink.  It also seems to be worse at night, when she has significant difficulty sleeping.  She denies any nausea, vomiting, changes in her bowel movements, dysuria, hematuria, or vaginal bleeding.  She has not taken anything for her pain at home.  She does have follow-up scheduled with her PCP next week, has not yet been evaluated for the symptoms.  She denies any abdominal surgical history.        Past Medical History:  Diagnosis Date  . Bilateral leg edema   . Carpal tunnel syndrome, bilateral   . Chronic foot pain   . Chronic low back pain   . Dysuria   . GERD (gastroesophageal reflux disease)   . High blood pressure   . High cholesterol   . Hypercalcemia   . Hypertension   . Lacunar stroke (HCC)   . Obesity   . Stroke [facility])    Hx    Patient Active Problem List   Diagnosis Date Noted  . Edema of both lower extremities 11/11/2018  . HTN (hypertension) 09/28/2014  . HLD (hyperlipidemia) 09/28/2014    Past Surgical History:  Procedure Laterality Date  . ABDOMINAL HYSTERECTOMY    . BREAST EXCISIONAL BIOPSY Left 90's   benign    Prior to Admission medications   Medication Sig Start Date End Date Taking? Authorizing Provider  amoxicillin-clavulanate (AUGMENTIN) 875-125 MG tablet Take 1 tablet by mouth 2  (two) times daily for 7 days. 09/09/20 09/16/20 Yes Chesley Noon, MD  aspirin EC 81 MG tablet Take 81 mg by mouth daily.    [provider]  atorvastatin (LIPITOR) 10 MG tablet Take 1 tablet (10 mg total) by mouth daily. 09/28/14   Ambrose Finland, NP  Elastic Bandages & Supports (JOBST KNEE HIGH COMPRESSION SM) MISC by Does not apply route.    [provider]  fluticasone (FLONASE) 50 MCG/ACT nasal spray Place 2 sprays into both nostrils daily.    [provider]  gabapentin (NEURONTIN) 100 MG capsule Take 1 capsule (100 mg total) by mouth 3 (three) times daily. 08/20/18 08/20/19  Emily Filbert, MD  hydrocortisone (ANUSOL-HC) 2.5 % rectal cream Place 1 application rectally 2 (two) times daily. 09/28/14   Ambrose Finland, NP  lisinopril-hydrochlorothiazide (ZESTORETIC) 20-12.5 MG per tablet Take 1 tablet by mouth daily. 09/28/14   Ambrose Finland, NP  pyridOXINE (VITAMIN B-6) 100 MG tablet Take 100 mg by mouth daily.    [provider]  Vitamin D, Ergocalciferol, (DRISDOL) 50000 UNITS CAPS capsule Take 1 capsule (50,000 Units total) by mouth every 7 (seven) days. 09/28/14   Ambrose Finland, NP    Allergies Tramadol  Family History  Problem Relation Age of Onset  . Stroke Mother 36  . Kidney disease Father 5       MI  . Heart disease  Father   . Breast cancer Sister 41  . High Cholesterol Neg Hx     Social History Social History   Tobacco Use  . Smoking status: Never Smoker  . Smokeless tobacco: Never Used  Vaping Use  . Vaping Use: Never used  Substance Use Topics  . Alcohol use: No    Alcohol/week: 0.0 standard drinks  . Drug use: No    Review of Systems  Constitutional: No fever/chills Eyes: No visual changes. ENT: No sore throat. Cardiovascular: Denies chest pain. Respiratory: Denies shortness of breath. Gastrointestinal: Positive for abdominal pain.  No nausea, no vomiting.  No diarrhea.  No constipation. Genitourinary: Negative  for dysuria. Musculoskeletal: Negative for back pain. Skin: Negative for rash. Neurological: Negative for headaches, focal weakness or numbness.  ____________________________________________   PHYSICAL EXAM:  VITAL SIGNS: ED Triage Vitals  Enc Vitals Group     BP 09/09/20 0959 130/69     Pulse Rate 09/09/20 0959 75     Resp 09/09/20 0959 16     Temp 09/09/20 0959 98.1 F (36.7 C)     Temp Source 09/09/20 0959 Oral     SpO2 09/09/20 0959 95 %     Weight --      Height --      Head Circumference --      Peak Flow --      Pain Score 09/09/20 0956 6     Pain Loc --      Pain Edu? --      Excl. in GC? --     Constitutional: Alert and oriented. Eyes: Conjunctivae are normal. Head: Atraumatic. Nose: No congestion/rhinnorhea. Mouth/Throat: Mucous membranes are moist. Neck: Normal ROM Cardiovascular: Normal rate, regular rhythm. Grossly normal heart sounds. Respiratory: Normal respiratory effort.  No retractions. Lungs CTAB. Gastrointestinal: Soft and tender to palpation in the bilateral lower quadrants. No distention. Genitourinary: deferred Musculoskeletal: No lower extremity tenderness nor edema. Neurologic:  Normal speech and language. No gross focal neurologic deficits are appreciated. Skin:  Skin is warm, dry and intact. No rash noted. Psychiatric: Mood and affect are normal. Speech and behavior are normal.  ____________________________________________   LABS (all labs ordered are listed, but only abnormal results are displayed)  Labs Reviewed  URINALYSIS, COMPLETE (UACMP) WITH MICROSCOPIC - Abnormal; Notable for the following components:      Result Value   Color, Urine YELLOW (*)    APPearance CLEAR (*)    All other components within normal limits  LIPASE, BLOOD  COMPREHENSIVE METABOLIC PANEL  CBC    PROCEDURES  Procedure(s) performed (including Critical Care):  Procedures   ____________________________________________   INITIAL IMPRESSION /  ASSESSMENT AND PLAN / ED COURSE       65 year old female with past medical history of hypertension, hyperlipidemia, stroke, and GERD who presents to the ED complaining of approximately 1 week of bilateral lower quadrant abdominal pain exacerbated by eating or drinking.  She has bilateral lower quadrant abdominal tenderness on exam and we will further assess with CT scan.  Labs thus far are unremarkable, LFTs and lipase within normal limits.  UA shows no evidence of infection.  We will treat patient's pain with IV Toradol and reassess.  CT scan shows uncomplicated diverticulitis, patient tolerated initial dose of Augmentin here in the ED without difficulty.  Patient is appropriate for discharge home with PCP follow-up, was counseled to return to the ED for new or worsening symptoms, patient agrees with plan.      ____________________________________________  FINAL CLINICAL IMPRESSION(S) / ED DIAGNOSES  Final diagnoses:  Diverticulitis     ED Discharge Orders         Ordered    amoxicillin-clavulanate (AUGMENTIN) 875-125 MG tablet  2 times daily        09/09/20 1421           Note:  This document was prepared using Dragon voice recognition software and may include unintentional dictation errors.   Chesley Noon, MD 09/09/20 1423

## 2020-09-09 NOTE — ED Triage Notes (Addendum)
Patient reports 1 week history of lower abdominal pain. States that when she eats or uses restroom she has lower abdominal cramping. No nausea or vomiting. Denies actually urinary symptoms.   Has appt with PCP next week (25th)

## 2020-09-14 ENCOUNTER — Other Ambulatory Visit: Payer: Self-pay

## 2020-09-14 ENCOUNTER — Emergency Department
Admission: EM | Admit: 2020-09-14 | Discharge: 2020-09-14 | Disposition: A | Discharge status: Home / Self Care | Payer: BLUE CROSS/BLUE SHIELD | Attending: Emergency Medicine | Admitting: Emergency Medicine

## 2020-09-14 ENCOUNTER — Encounter: Payer: Self-pay | Admitting: Emergency Medicine

## 2020-09-14 DIAGNOSIS — Z7982 Long term (current) use of aspirin: Secondary | ICD-10-CM | POA: Diagnosis not present

## 2020-09-14 DIAGNOSIS — R112 Nausea with vomiting, unspecified: Secondary | ICD-10-CM | POA: Diagnosis present

## 2020-09-14 DIAGNOSIS — K5792 Diverticulitis of intestine, part unspecified, without perforation or abscess without bleeding: Secondary | ICD-10-CM | POA: Diagnosis not present

## 2020-09-14 DIAGNOSIS — Z79899 Other long term (current) drug therapy: Secondary | ICD-10-CM | POA: Insufficient documentation

## 2020-09-14 DIAGNOSIS — I10 Essential (primary) hypertension: Secondary | ICD-10-CM | POA: Insufficient documentation

## 2020-09-14 DIAGNOSIS — R519 Headache, unspecified: Secondary | ICD-10-CM | POA: Diagnosis not present

## 2020-09-14 LAB — CBC
HCT: 39.2 % (ref 36.0–46.0)
Hemoglobin: 13.2 g/dL (ref 12.0–15.0)
MCH: 29.1 pg (ref 26.0–34.0)
MCHC: 33.7 g/dL (ref 30.0–36.0)
MCV: 86.3 fL (ref 80.0–100.0)
Platelets: 225 10*3/uL (ref 150–400)
RBC: 4.54 MIL/uL (ref 3.87–5.11)
RDW: 11.7 % (ref 11.5–15.5)
WBC: 8.7 10*3/uL (ref 4.0–10.5)
nRBC: 0 % (ref 0.0–0.2)

## 2020-09-14 LAB — COMPREHENSIVE METABOLIC PANEL
ALT: 28 U/L (ref 0–44)
AST: 22 U/L (ref 15–41)
Albumin: 3.9 g/dL (ref 3.5–5.0)
Alkaline Phosphatase: 75 U/L (ref 38–126)
Anion gap: 10 (ref 5–15)
BUN: 14 mg/dL (ref 8–23)
CO2: 26 mmol/L (ref 22–32)
Calcium: 9.9 mg/dL (ref 8.9–10.3)
Chloride: 103 mmol/L (ref 98–111)
Creatinine, Ser: 0.74 mg/dL (ref 0.44–1.00)
GFR, Estimated: >60 mL/min (ref >60)
Glucose, Bld: 120 mg/dL — ABNORMAL HIGH (ref 70–99)
Potassium: 3.8 mmol/L (ref 3.5–5.1)
Sodium: 139 mmol/L (ref 135–145)
Total Bilirubin: 0.7 mg/dL (ref 0.3–1.2)
Total Protein: 7.6 g/dL (ref 6.5–8.1)

## 2020-09-14 LAB — LIPASE, BLOOD: Lipase: 27 U/L (ref 11–51)

## 2020-09-14 MED ORDER — KETOROLAC TROMETHAMINE 30 MG/ML IJ SOLN
30.0000 mg | Freq: Once | INTRAMUSCULAR | Status: AC
  Administered 2020-09-14: 30 mg via INTRAMUSCULAR
  Filled 2020-09-14: qty 1

## 2020-09-14 MED ORDER — ONDANSETRON 4 MG PO TBDP
4.0000 mg | ORAL_TABLET | Freq: Once | ORAL | Status: AC | PRN
  Administered 2020-09-14: 4 mg via ORAL
  Filled 2020-09-14: qty 1

## 2020-09-14 MED ORDER — ONDANSETRON 4 MG PO TBDP: 4.0000 mg | ORAL_TABLET | Freq: Three times a day (TID) | ORAL | 0 refills | Status: AC | PRN

## 2020-09-14 NOTE — ED Notes (Signed)
No e-signature pad available to obtain discharge e-signature.  Pt verbalized understanding of all discharge instructions, medications, and f/u care.

## 2020-09-14 NOTE — ED Provider Notes (Signed)
[facility] Emergency Department Provider Note   ____________________________________________   None    (approximate)  I have reviewed the triage vital signs and the nursing notes.   HISTORY  Chief Complaint Emesis    HPI Brandy Braun is a 65 y.o. female with past medical history of hypertension, hyperlipidemia, and stroke who presents to the ED complaining of headache with nausea and vomiting.  Patient reports that she developed headache with nausea and occasional vomiting earlier this morning.  She has had a hard time keeping down Augmentin that she was recently prescribed for diverticulitis.  She attributes the headache and nausea to the COVID-19 booster that she received yesterday.  She does state that overall her abdominal pain has been improving since starting the antibiotic and she has not had any fevers or changes in bowel movements.  She received a dose of Zofran in triage and now states that nausea has resolved.  She does complain of ongoing headache, denies any vision changes, speech changes, numbness, or weakness.        Past Medical History:  Diagnosis Date  . Bilateral leg edema   . Carpal tunnel syndrome, bilateral   . Chronic foot pain   . Chronic low back pain   . Dysuria   . GERD (gastroesophageal reflux disease)   . High blood pressure   . High cholesterol   . Hypercalcemia   . Hypertension   . Lacunar stroke (HCC)   . Obesity   . Stroke [facility])    Hx    Patient Active Problem List   Diagnosis Date Noted  . Edema of both lower extremities 11/11/2018  . HTN (hypertension) 09/28/2014  . HLD (hyperlipidemia) 09/28/2014    Past Surgical History:  Procedure Laterality Date  . ABDOMINAL HYSTERECTOMY    . BREAST EXCISIONAL BIOPSY Left 90's   benign    Prior to Admission medications   Medication Sig Start Date End Date Taking? Authorizing Provider  ondansetron (ZOFRAN ODT) 4 MG disintegrating tablet Take 1 tablet (4 mg  total) by mouth every 8 (eight) hours as needed for nausea or vomiting. 09/14/20  Yes Chesley Noon, MD  amoxicillin-clavulanate (AUGMENTIN) 875-125 MG tablet Take 1 tablet by mouth 2 (two) times daily for 7 days. 09/09/20 09/16/20  Chesley Noon, MD  aspirin EC 81 MG tablet Take 81 mg by mouth daily.    [provider]  atorvastatin (LIPITOR) 10 MG tablet Take 1 tablet (10 mg total) by mouth daily. 09/28/14   Ambrose Finland, NP  Elastic Bandages & Supports (JOBST KNEE HIGH COMPRESSION SM) MISC by Does not apply route.    [provider]  fluticasone (FLONASE) 50 MCG/ACT nasal spray Place 2 sprays into both nostrils daily.    [provider]  gabapentin (NEURONTIN) 100 MG capsule Take 1 capsule (100 mg total) by mouth 3 (three) times daily. 08/20/18 08/20/19  Emily Filbert, MD  hydrocortisone (ANUSOL-HC) 2.5 % rectal cream Place 1 application rectally 2 (two) times daily. 09/28/14   Ambrose Finland, NP  lisinopril-hydrochlorothiazide (ZESTORETIC) 20-12.5 MG per tablet Take 1 tablet by mouth daily. 09/28/14   Ambrose Finland, NP  pyridOXINE (VITAMIN B-6) 100 MG tablet Take 100 mg by mouth daily.    [provider]  Vitamin D, Ergocalciferol, (DRISDOL) 50000 UNITS CAPS capsule Take 1 capsule (50,000 Units total) by mouth every 7 (seven) days. 09/28/14   Ambrose Finland, NP    Allergies Tramadol  Family History  Problem Relation Age of Onset  . Stroke Mother 46  . Kidney disease Father 46       MI  . Heart disease Father   . Breast cancer Sister 63  . High Cholesterol Neg Hx     Social History Social History   Tobacco Use  . Smoking status: Never Smoker  . Smokeless tobacco: Never Used  Vaping Use  . Vaping Use: Never used  Substance Use Topics  . Alcohol use: No    Alcohol/week: 0.0 standard drinks  . Drug use: No    Review of Systems  Constitutional: No fever/chills Eyes: No visual changes. ENT: No sore throat. Cardiovascular: Denies  chest pain. Respiratory: Denies shortness of breath. Gastrointestinal: No abdominal pain.  Positive for nausea and vomiting.  No diarrhea.  No constipation. Genitourinary: Negative for dysuria. Musculoskeletal: Negative for back pain. Skin: Negative for rash. Neurological: Positive for headaches, negative for focal weakness or numbness.  ____________________________________________   PHYSICAL EXAM:  VITAL SIGNS: ED Triage Vitals  Enc Vitals Group     BP 09/14/20 1245 136/60     Pulse Rate 09/14/20 1245 93     Resp 09/14/20 1245 20     Temp 09/14/20 1245 98.9 F (37.2 C)     Temp Source 09/14/20 1245 Oral     SpO2 09/14/20 1245 98 %     Weight 09/14/20 1210 195 lb 1.7 oz (88.5 kg)     Height 09/14/20 1210 5\' 4"  (1.626 m)     Head Circumference --      Peak Flow --      Pain Score 09/14/20 1210 0     Pain Loc --      Pain Edu? --      Excl. in GC? --     Constitutional: Alert and oriented. Eyes: Conjunctivae are normal. Head: Atraumatic. Nose: No congestion/rhinnorhea. Mouth/Throat: Mucous membranes are moist. Neck: Normal ROM Cardiovascular: Normal rate, regular rhythm. Grossly normal heart sounds. Respiratory: Normal respiratory effort.  No retractions. Lungs CTAB. Gastrointestinal: Soft and nontender. No distention. Genitourinary: deferred Musculoskeletal: No lower extremity tenderness nor edema. Neurologic:  Normal speech and language. No gross focal neurologic deficits are appreciated. Skin:  Skin is warm, dry and intact. No rash noted. Psychiatric: Mood and affect are normal. Speech and behavior are normal.  ____________________________________________   LABS (all labs ordered are listed, but only abnormal results are displayed)  Labs Reviewed  COMPREHENSIVE METABOLIC PANEL - Abnormal; Notable for the following components:      Result Value   Glucose, Bld 120 (*)    All other components within normal limits  LIPASE, BLOOD  CBC  URINALYSIS, COMPLETE  (UACMP) WITH MICROSCOPIC    PROCEDURES  Procedure(s) performed (including Critical Care):  Procedures   ____________________________________________   INITIAL IMPRESSION / ASSESSMENT AND PLAN / ED COURSE       65 year old female with past medical history of hypertension, hyperlipidemia, and stroke who presents to the ED with headache, nausea, and vomiting after recent diagnosis of diverticulitis and after receiving COVID-19 booster yesterday.  She has no abdominal tenderness on exam and nausea has resolved following dose of Zofran, no reason to suspect worsening diverticulitis.  Labs and vitals are also reassuring.  She likely has some body aches and headache related to immune response from receiving COVID-19 booster.  Gradual worsening of headache does not suggest SAH and she has no fever or meningismus.  No focal neurologic deficits noted on exam.  Patient was given  dose of IM Toradol and reports improvement in headache.  She is appropriate for discharge home and was prescribed Zofran, counseled to continue Augmentin course until done.  She was also counseled to follow-up with her PCP and to return to the ED for new or worsening symptoms, patient agrees with plan.      ____________________________________________   FINAL CLINICAL IMPRESSION(S) / ED DIAGNOSES  Final diagnoses:  Diverticulitis  Non-intractable vomiting with nausea, unspecified vomiting type  Acute nonintractable headache, unspecified headache type     ED Discharge Orders         Ordered    ondansetron (ZOFRAN ODT) 4 MG disintegrating tablet  Every 8 hours PRN        09/14/20 1619           Note:  This document was prepared using Dragon voice recognition software and may include unintentional dictation errors.   Chesley Noon, MD 09/14/20 1650

## 2020-09-14 NOTE — ED Triage Notes (Signed)
Arrives c/o vomiting since 030 and headache. STates took Covid booster yesterday.  AAOx3.  Skin warm and dry. NAD

## 2020-09-14 NOTE — ED Notes (Signed)
AAOx3.  Tolerated water without difficulty.  Only c/o headache.  NAD

## 2021-10-09 ENCOUNTER — Other Ambulatory Visit: Payer: Self-pay | Admitting: Family Medicine

## 2021-10-09 DIAGNOSIS — Z1231 Encounter for screening mammogram for malignant neoplasm of breast: Secondary | ICD-10-CM

## 2021-11-12 ENCOUNTER — Ambulatory Visit
Admission: RE | Admit: 2021-11-12 | Discharge: 2021-11-12 | Disposition: A | Discharge status: Home / Self Care | Payer: BLUE CROSS/BLUE SHIELD | Source: Ambulatory Visit | Attending: Family Medicine | Admitting: Family Medicine

## 2021-11-12 ENCOUNTER — Other Ambulatory Visit: Payer: Self-pay

## 2021-11-12 DIAGNOSIS — Z1231 Encounter for screening mammogram for malignant neoplasm of breast: Secondary | ICD-10-CM | POA: Diagnosis present

## 2024-05-12 ENCOUNTER — Emergency Department
Admission: EM | Admit: 2024-05-12 | Discharge: 2024-05-12 | Disposition: A | Discharge status: Home / Self Care | Attending: Emergency Medicine | Admitting: Emergency Medicine

## 2024-05-12 ENCOUNTER — Other Ambulatory Visit: Payer: Self-pay

## 2024-05-12 DIAGNOSIS — Z856 Personal history of leukemia: Secondary | ICD-10-CM | POA: Diagnosis not present

## 2024-05-12 DIAGNOSIS — Z7901 Long term (current) use of anticoagulants: Secondary | ICD-10-CM | POA: Insufficient documentation

## 2024-05-12 DIAGNOSIS — M792 Neuralgia and neuritis, unspecified: Secondary | ICD-10-CM | POA: Insufficient documentation

## 2024-05-12 DIAGNOSIS — M79601 Pain in right arm: Secondary | ICD-10-CM | POA: Diagnosis present

## 2024-05-12 LAB — COMPREHENSIVE METABOLIC PANEL WITH GFR
ALT: 19 U/L (ref 0–44)
AST: 15 U/L (ref 15–41)
Albumin: 3.8 g/dL (ref 3.5–5.0)
Alkaline Phosphatase: 87 U/L (ref 38–126)
Anion gap: 8 (ref 5–15)
BUN: 12 mg/dL (ref 8–23)
CO2: 29 mmol/L (ref 22–32)
Calcium: 9.8 mg/dL (ref 8.9–10.3)
Chloride: 102 mmol/L (ref 98–111)
Creatinine, Ser: 0.58 mg/dL (ref 0.44–1.00)
GFR, Estimated: >60 mL/min (ref >60)
Glucose, Bld: 98 mg/dL (ref 70–99)
Potassium: 4.4 mmol/L (ref 3.5–5.1)
Sodium: 139 mmol/L (ref 135–145)
Total Bilirubin: 0.7 mg/dL (ref 0.0–1.2)
Total Protein: 6.9 g/dL (ref 6.5–8.1)

## 2024-05-12 LAB — CBC WITH DIFFERENTIAL/PLATELET
Abs Immature Granulocytes: 0.02 K/uL (ref 0.00–0.07)
Basophils Absolute: 0.1 K/uL (ref 0.0–0.1)
Basophils Relative: 1 %
Eosinophils Absolute: 0.2 K/uL (ref 0.0–0.5)
Eosinophils Relative: 2 %
HCT: 43.7 % (ref 36.0–46.0)
Hemoglobin: 14.7 g/dL (ref 12.0–15.0)
Immature Granulocytes: 0 %
Lymphocytes Relative: 30 %
Lymphs Abs: 2.7 K/uL (ref 0.7–4.0)
MCH: 29.1 pg (ref 26.0–34.0)
MCHC: 33.6 g/dL (ref 30.0–36.0)
MCV: 86.4 fL (ref 80.0–100.0)
Monocytes Absolute: 0.6 K/uL (ref 0.1–1.0)
Monocytes Relative: 6 %
Neutro Abs: 5.4 K/uL (ref 1.7–7.7)
Neutrophils Relative %: 61 %
Platelets: 200 K/uL (ref 150–400)
RBC: 5.06 MIL/uL (ref 3.87–5.11)
RDW: 12.7 % (ref 11.5–15.5)
WBC: 9 K/uL (ref 4.0–10.5)
nRBC: 0 % (ref 0.0–0.2)

## 2024-05-12 LAB — TROPONIN I (HIGH SENSITIVITY): Troponin I (High Sensitivity): 3 ng/L (ref <18)

## 2024-05-12 MED ORDER — GABAPENTIN 100 MG PO CAPS
100.0000 mg | ORAL_CAPSULE | Freq: Once | ORAL | Status: AC
  Administered 2024-05-12: 100 mg via ORAL
  Filled 2024-05-12: qty 1

## 2024-05-12 MED ORDER — ACETAMINOPHEN 500 MG PO TABS
1000.0000 mg | ORAL_TABLET | Freq: Once | ORAL | Status: AC
  Administered 2024-05-12: 1000 mg via ORAL
  Filled 2024-05-12: qty 2

## 2024-05-12 NOTE — ED Triage Notes (Signed)
 Pt to Ed from [facility] [employer]. C/C ride sided pain. 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 Unknown last known well. Pt + for blood thinner. Pt is aphasic, Paralysis on right side since January. Ems reports FAST ED score of 4 at baseline. Facility reports no changes in symptoms other than pain on right side.   EMS vitals  HR 69 BP 134/81 CBG 91

## 2024-05-12 NOTE — ED Provider Notes (Signed)
 [facility] Provider Note    Event Date/Time   First MD Initiated Contact with Patient 05/12/24 1232     (approximate)   History   Arm Pain   HPI  Brandy Braun is a 68 y.o. 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  Patient has a ischemic stroke that has made it her having expressive aphasia and right hemiplegia.  I reviewed a oncology note from 10/03/2023 where patient has known neuropathic pain along the side.  She points to her mouth as well as her arm.  We used a interpreter and she was able to nod yes or no.  She denied any chest pain, shortness of breath, headaches, falls I reviewed a note where it sounds like patient is on gabapentin  200 at nighttime.  She is on Eliquis.    12:52 PM according to facility they deny any known falls.  They report that she was complaining of pain on her right side.  They did not give her any medications for her pain before coming in.   Physical Exam   Triage Vital Signs: ED Triage Vitals  Encounter Vitals Group     BP      Girls Systolic BP Percentile      Girls Diastolic BP Percentile      Boys Systolic BP Percentile      Boys Diastolic BP Percentile      Pulse      Resp      Temp      Temp src      SpO2      Weight      Height      Head Circumference      Peak Flow      Pain Score      Pain Loc      Pain Education      Exclude from Growth Chart     Most recent vital signs: Vitals:   05/12/24 1239  BP: (!) 153/97  Pulse: 86  Resp: 20  SpO2: 100%     General: Awake, no distress.  CV:  Good peripheral perfusion.  Resp:  Normal effort.  Abd:  No distention.  Soft and nontender Other:  Right handed contracture taken down the wrist brace and she has got good distal pulse.  Warm and well-perfused.  She reports some pain on her right face I took a look at her mouth no signs of  infection.  She is able to move her left side.  She has her baseline deficit on the right but no new cranial nerve deficits.   ED Results / Procedures / Treatments   Labs (all labs ordered are listed, but only abnormal results are displayed) Labs Reviewed - No data to display   EKG  My interpretation of EKG:  Normal sinus rhythm 71 without any ST elevation, T wave inversion lead III and V3, normal intervals   PROCEDURES:  Critical Care performed: No  Procedures   MEDICATIONS ORDERED IN ED: Medications  acetaminophen  (TYLENOL ) tablet 1,000 mg (1,000 mg Oral Given 05/12/24 1257)  gabapentin  (NEURONTIN ) capsule 100 mg (100 mg Oral Given 05/12/24 1257)     IMPRESSION / MDM / ASSESSMENT AND PLAN / ED COURSE  I reviewed the triage vital signs and the nursing notes.   Patient's presentation is most consistent with acute presentation with potential threat to life  or bodily function.  Difficult to get history from patient given she does have expressive aphasia but she is able to nod yes and no and it sounds like with the interpreter's help but she is only having pain in her right arm and right face.  Daughter and later came to bedside he reports that this has been chronic in nature but this is not new she is unclear why the facility brought her here when I called facility they stated that she just had this pain and so they had sent her and they did not giving her anything for the pain.  Will trial some gabapentin , Tylenol  and some basic labs were ordered to ensure no other pathology given somewhat limited history due to her aphasia.  Troponin was negative CBC reassuring CMP reassuring  Discussed with patient's sister POA who feels comfortable having her go back to the facility and patient has improvement of symptoms with medications.      FINAL CLINICAL IMPRESSION(S) / ED DIAGNOSES   Final diagnoses:  Neuropathic pain     Rx / DC Orders   ED Discharge Orders     None         Note:  This document was prepared using Dragon voice recognition software and may include unintentional dictation errors.   [name], MD 05/12/24 [phone]

## 2024-05-12 NOTE — Discharge Instructions (Addendum)
 She has a history of chronic pain on her right arm and right face secondary to her prior stroke.  If you develop any change in symptoms such as fevers, swelling, redness she should return to the ER.  Her lab work was reassuring and family was at bedside who reports that she typically has the symptoms.  She was improved with a dose of gabapentin  and Tylenol .  She can take Tylenol  1 g every 8 hours as needed to help with any pain.  Return to the ER for any other concerns
